# Patient Record
Sex: Male | Born: 1955
Health system: Southern US, Community
[De-identification: ages and names within clinical notes are randomized; demographics above are authoritative.]

## PROBLEM LIST (undated history)

## (undated) DIAGNOSIS — M1990 Unspecified osteoarthritis, unspecified site: Secondary | ICD-10-CM

## (undated) DIAGNOSIS — F988 Other specified behavioral and emotional disorders with onset usually occurring in childhood and adolescence: Secondary | ICD-10-CM

## (undated) DIAGNOSIS — Z9689 Presence of other specified functional implants: Secondary | ICD-10-CM

## (undated) DIAGNOSIS — G0490 Encephalitis and encephalomyelitis, unspecified: Secondary | ICD-10-CM

## (undated) DIAGNOSIS — H5440 Blindness, one eye, unspecified eye: Secondary | ICD-10-CM

## (undated) DIAGNOSIS — M1710 Unilateral primary osteoarthritis, unspecified knee: Secondary | ICD-10-CM

## (undated) DIAGNOSIS — M549 Dorsalgia, unspecified: Secondary | ICD-10-CM

## (undated) DIAGNOSIS — K5792 Diverticulitis of intestine, part unspecified, without perforation or abscess without bleeding: Secondary | ICD-10-CM

## (undated) DIAGNOSIS — Z87442 Personal history of urinary calculi: Secondary | ICD-10-CM

## (undated) DIAGNOSIS — Z9889 Other specified postprocedural states: Secondary | ICD-10-CM

## (undated) DIAGNOSIS — G9050 Complex regional pain syndrome I, unspecified: Secondary | ICD-10-CM

## (undated) DIAGNOSIS — L0291 Cutaneous abscess, unspecified: Secondary | ICD-10-CM

## (undated) HISTORY — PX: SPINAL CORD STIMULATOR IMPLANT: SHX2422

## (undated) HISTORY — PX: FOOT NEUROMA SURGERY: SHX646

## (undated) HISTORY — PX: TUMOR REMOVAL: SHX12

## (undated) HISTORY — PX: EYE SURGERY: SHX253

## (undated) HISTORY — PX: HERNIA REPAIR: SHX51

## (undated) HISTORY — PX: OTHER SURGICAL HISTORY: SHX169

---

## 1998-11-09 ENCOUNTER — Ambulatory Visit (HOSPITAL_BASED_OUTPATIENT_CLINIC_OR_DEPARTMENT_OTHER): Admission: RE | Admit: 1998-11-09 | Discharge: 1998-11-09 | Payer: Self-pay | Admitting: Orthopedic Surgery

## 1999-07-02 ENCOUNTER — Ambulatory Visit (HOSPITAL_COMMUNITY): Admission: RE | Admit: 1999-07-02 | Discharge: 1999-07-02 | Payer: Self-pay | Admitting: *Deleted

## 1999-07-02 ENCOUNTER — Encounter: Payer: Self-pay | Admitting: *Deleted

## 1999-07-04 ENCOUNTER — Encounter: Payer: Self-pay | Admitting: *Deleted

## 1999-07-04 ENCOUNTER — Ambulatory Visit (HOSPITAL_COMMUNITY): Admission: RE | Admit: 1999-07-04 | Discharge: 1999-07-04 | Payer: Self-pay | Admitting: *Deleted

## 2000-07-21 ENCOUNTER — Emergency Department (HOSPITAL_COMMUNITY): Admission: EM | Admit: 2000-07-21 | Discharge: 2000-07-21 | Payer: Self-pay | Admitting: Emergency Medicine

## 2000-08-08 ENCOUNTER — Encounter: Payer: Self-pay | Admitting: Physical Medicine and Rehabilitation

## 2000-08-08 ENCOUNTER — Ambulatory Visit (HOSPITAL_COMMUNITY)
Admission: RE | Admit: 2000-08-08 | Discharge: 2000-08-08 | Payer: Self-pay | Admitting: Physical Medicine and Rehabilitation

## 2001-10-17 ENCOUNTER — Emergency Department (HOSPITAL_COMMUNITY): Admission: EM | Admit: 2001-10-17 | Discharge: 2001-10-17 | Payer: Self-pay

## 2003-11-15 ENCOUNTER — Ambulatory Visit (HOSPITAL_COMMUNITY): Admission: RE | Admit: 2003-11-15 | Discharge: 2003-11-15 | Payer: Self-pay | Admitting: Family Medicine

## 2007-11-03 ENCOUNTER — Ambulatory Visit (HOSPITAL_BASED_OUTPATIENT_CLINIC_OR_DEPARTMENT_OTHER)
Admission: RE | Admit: 2007-11-03 | Discharge: 2007-11-03 | Payer: Self-pay | Admitting: Physical Medicine and Rehabilitation

## 2007-11-18 ENCOUNTER — Ambulatory Visit (HOSPITAL_COMMUNITY): Admission: RE | Admit: 2007-11-18 | Discharge: 2007-11-19 | Payer: Self-pay | Admitting: Orthopedic Surgery

## 2011-04-02 NOTE — Op Note (Signed)
NAME:  Alexander Harper, Alexander Harper                   ACCOUNT NO.:  [id number]   MEDICAL RECORD NO.:  [id number]          PATIENT TYPE:  AMB   LOCATION:  DSC                          FACILITY:  [facility]   PHYSICIAN:  Richard D. Ethelene Hal, M.D. DATE OF BIRTH:  06/14/1956   DATE OF PROCEDURE:  11/03/2007  DATE OF DISCHARGE:                               OPERATIVE REPORT   Alexander Harper is a 55 year old male who is having chronic pain lower limb  pain, specifically in his foot and ankle.  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  He has failed lumbar  sympathetic blocks.  Neurontin gives him a little bit of relief, but it  does cause some cognitive side-effects.  After reviewing information on  the spinal cord stimulator, he wanted to proceed with a trial and  possible permanent implantation to help treat his pain and increase his  function and potentially get off of the Neurontin.   PROCEDURE:  Percutaneous placement of ANS spinal cord stimulator lead  Octrode, model number [id number], lot number [id number].   ANESTHESIA:  Conscious sedation.   The patient was pre-medicated with 300 mg of  IV Clindamycin. After  informed consent was signed, the patient was brought back to the  operating room and placed prone on the operating table.  His skin was  cleansed with Betadine x3.  He was sterilely draped.  With a 14 gauge 4  inch spinal needle, I placed this in the L2-L3 interspace using  intermittent AP fluoroscopic imaging.  The epidural space was localized  using loss of resistance technique.  I then placed the ANS Octrode trial  lead through the needle.  This was done under intermittent fluoroscopic  imaging.  I placed the superior aspect of the lead at the inferior  aspect of the T11 vertebral body.  The lead was checked under AP and  lateral fluoroscopic imaging and was noted to be in the appropriate  position.  The lead was then  hooked up to the generator with the ANS  representative present.  The upper leads got excellent coverage in the  knee and below into the foot.  The lower leads got coverage into the  left foot where he does experience 5% of his pain.  The middle leads got  good coverage on both sides but the paresthesias were also felt in the  thigh region which is not an area of his pain.  This was deemed to be a  good trial with good coverage of the pain that he experiences everyday.  I then withdrew the stylet as well as the introducer needle under  intermittent fluoroscopic imaging to make sure the spinal cord  stimulator lead did not move.  Using 2-0 Prolene, I sutured the lead to  the skin x2.  A Tegaderm dressing was then applied.  The patient was  brought back to the recovery room in stable condition.  The patient will  follow up with me in one week.  Richard D. Ethelene Hal, M.D.  Electronically Signed     RDR/MEDQ  D:  11/03/2007  T:  11/03/2007  Job:  [id number]

## 2011-04-02 NOTE — Op Note (Signed)
NAMEFRANS, Alexander Harper                   ACCOUNT NO.:  [id number]   MEDICAL RECORD NO.:  [id number]          PATIENT TYPE:  OIB   LOCATION:  5015                         FACILITY:  [facility]   PHYSICIAN:  Alvy Beal, MD    DATE OF BIRTH:  November 18, 1956   DATE OF PROCEDURE:  DATE OF DISCHARGE:                               OPERATIVE REPORT   PREOPERATIVE DIAGNOSIS:  Chronic bilateral lower extremity pain.   POSTOPERATIVE DIAGNOSIS:  Chronic bilateral lower extremity pain.   OPERATIVE PROCEDURE:  Permanent spinal cord stimulator implantation.   HISTORY:  This is a very pleasant 55 year old gentleman who has had  chronic bilateral lower extremity pain.  He had a trial spinal cord  stimulator placed by Dr. Ethelene Hal, my partner, and did quite well.  Because  of positive response to the trial implant, he elected to proceed with  the permanent implantation.   All appropriate risks, benefits and alternatives were discussed with the  patient and consent was obtained.   OPERATIVE NOTE:  The patient was brought to the operating room.  After  successful induction of general anesthesia and endotracheal intubation,  TEDs and SCDs were applied.  The patient was turned prone onto the  Wilson frame.  The arms were placed overhead and all bony prominences  were well-padded.   The back was then prepped and draped in a standard fashion.   Using spinal needles, I used lateral fluoroscopy to identify the L5  vertebral body and then counted up to the T12 vertebral body.  A midline  incision was then made centered at the T11-12 interbody space and sharp  dissection was carried out down to the deep fascia.  The deep fascia was  sharply incised and the paraspinal muscles were stripped off to expose  the lamina.  I then placed a clamp at the T12-11 interbody space and  confirmed that this was the appropriate level by counting up from the L5  vertebral body.  Once confirmed, I then used a Leksell rongeur to  resect  the majority of the T11 and T12 spinous processes and then exposed the  ligamentum flavum.  I used a micro fine curette to develop a plane  between the ligamentum flavum and the underlying dura and resected that  with a combination of 2- and 3-mm Kerrison.  At this point I had  excellent visualization of the dorsal aspect of the thecal sac.  At this  point, I then passed a Penfield 4 cranially in order to develop a slight  plane to pass the permanent implant.   With hemostasis obtained, I then took the standard 16 lead and passed it  up.  Using AP and lateral fluoroscopy, I confirmed that it was in the  central region equidistant to both pedicles and spanning the T11  vertebral body and going just to the inferior aspect of the T10  vertebral body.   At this point with the implant properly positioned, I then made a small  window at T12-L1 interspinous ligament and wove the leads through this  to prevent migration.  I then secured it down with a 2-0 FiberWire.  I  then rechecked the AP and lateral films to ensure that the stimulator  had not migrated.  Once confirmed, I then attempted to place the [facility]  battery; however, I made an incision on the left-hand side per the  patient's request and then dissected down.  The patient was quite thin  and so I actually I got submuscular very rapidly.  I then passed the  large Eon battery; however, because of his body habitus, I could not  implant it the necessary 4.5 cm in depth.  As a result, I had to switch  to the [employer].  This only needed to be at a depth of 2.5 cm  which was easy to achieve.  I then connected the leads to the battery  and checked the battery and ensured that it was working functionally.   I then passed the submuscular passer trocars from the thoracic incision  down to the left-sided flank incision where the battery would housed.  I  then passed the leads through this and then secured them to the battery.  I  then wrapped the leads underneath the battery and passed it into the  submuscular plane and secured it to the fascia.  I then irrigated both  wounds copiously with normal saline, closed the deep fascia with  interrupted #1 Vicryl sutures, superficial with 2-0 Vicryl sutures and 3-  0 Monocryl for the skin.  Once both wounds were appropriately closed, a  dry dressing was applied and the patient was extubated and transferred  to the PACU without incident.  At the end of the case, all needle and  sponge counts were correct.   FIRST ASSISTANT:  Crissie Reese, PA      Alvy Beal, MD  Electronically Signed     DDB/MEDQ  D:  11/18/2007  T:  11/18/2007  Job:  [id number]   cc:   Alvy Beal, MD

## 2011-08-23 LAB — CBC
MCV: 89.8
RBC: 4.57
RDW: 13.7

## 2011-08-23 LAB — COMPREHENSIVE METABOLIC PANEL
AST: 23
Alkaline Phosphatase: 91
GFR calc Af Amer: >60
Glucose, Bld: 101 — ABNORMAL HIGH
Total Bilirubin: 0.6

## 2011-08-23 LAB — POCT HEMOGLOBIN-HEMACUE: Operator id: 112821

## 2013-04-29 ENCOUNTER — Other Ambulatory Visit: Payer: Self-pay | Admitting: Family Medicine

## 2013-04-29 ENCOUNTER — Ambulatory Visit
Admission: RE | Admit: 2013-04-29 | Discharge: 2013-04-29 | Disposition: A | Discharge status: Home / Self Care | Payer: BC Managed Care – PPO | Source: Ambulatory Visit | Attending: Family Medicine | Admitting: Family Medicine

## 2013-04-29 DIAGNOSIS — R05 Cough: Secondary | ICD-10-CM

## 2013-04-29 DIAGNOSIS — R059 Cough, unspecified: Secondary | ICD-10-CM

## 2015-06-27 ENCOUNTER — Other Ambulatory Visit: Payer: Self-pay | Admitting: Family Medicine

## 2015-06-27 ENCOUNTER — Ambulatory Visit
Admission: RE | Admit: 2015-06-27 | Discharge: 2015-06-27 | Disposition: A | Discharge status: Home / Self Care | Payer: BLUE CROSS/BLUE SHIELD | Source: Ambulatory Visit | Attending: Family Medicine | Admitting: Family Medicine

## 2015-06-27 DIAGNOSIS — M79645 Pain in left finger(s): Secondary | ICD-10-CM

## 2015-06-27 DIAGNOSIS — M25522 Pain in left elbow: Secondary | ICD-10-CM

## 2017-05-23 ENCOUNTER — Other Ambulatory Visit: Payer: Self-pay

## 2017-05-23 DIAGNOSIS — M25562 Pain in left knee: Secondary | ICD-10-CM

## 2017-06-02 ENCOUNTER — Ambulatory Visit
Admission: RE | Admit: 2017-06-02 | Discharge: 2017-06-02 | Disposition: A | Discharge status: Home / Self Care | Payer: BLUE CROSS/BLUE SHIELD | Source: Ambulatory Visit | Attending: Orthopedic Surgery | Admitting: Orthopedic Surgery

## 2017-06-02 DIAGNOSIS — M25562 Pain in left knee: Secondary | ICD-10-CM

## 2017-06-02 MED ORDER — IOPAMIDOL (ISOVUE-M 200) INJECTION 41%
50.0000 mL | Freq: Once | INTRAMUSCULAR | Status: AC
  Administered 2017-06-02: 50 mL via INTRA_ARTICULAR

## 2017-09-18 DIAGNOSIS — L0291 Cutaneous abscess, unspecified: Secondary | ICD-10-CM

## 2017-09-18 HISTORY — DX: Cutaneous abscess, unspecified: L02.91

## 2017-12-16 ENCOUNTER — Other Ambulatory Visit: Payer: Self-pay

## 2017-12-16 ENCOUNTER — Encounter (HOSPITAL_COMMUNITY): Payer: Self-pay

## 2017-12-16 ENCOUNTER — Emergency Department (HOSPITAL_COMMUNITY)
Admission: EM | Admit: 2017-12-16 | Discharge: 2017-12-16 | Disposition: A | Discharge status: Home / Self Care | Payer: BLUE CROSS/BLUE SHIELD | Attending: Emergency Medicine | Admitting: Emergency Medicine

## 2017-12-16 DIAGNOSIS — R590 Localized enlarged lymph nodes: Secondary | ICD-10-CM | POA: Insufficient documentation

## 2017-12-16 DIAGNOSIS — Z79899 Other long term (current) drug therapy: Secondary | ICD-10-CM | POA: Insufficient documentation

## 2017-12-16 DIAGNOSIS — L089 Local infection of the skin and subcutaneous tissue, unspecified: Secondary | ICD-10-CM | POA: Diagnosis not present

## 2017-12-16 DIAGNOSIS — F17210 Nicotine dependence, cigarettes, uncomplicated: Secondary | ICD-10-CM | POA: Insufficient documentation

## 2017-12-16 DIAGNOSIS — L729 Follicular cyst of the skin and subcutaneous tissue, unspecified: Secondary | ICD-10-CM

## 2017-12-16 HISTORY — DX: Complex regional pain syndrome I, unspecified: G90.50

## 2017-12-16 HISTORY — DX: Other specified postprocedural states: Z98.890

## 2017-12-16 HISTORY — DX: Blindness, one eye, unspecified eye: H54.40

## 2017-12-16 HISTORY — DX: Encephalitis and encephalomyelitis, unspecified: G04.90

## 2017-12-16 HISTORY — DX: Presence of other specified functional implants: Z96.89

## 2017-12-16 HISTORY — DX: Diverticulitis of intestine, part unspecified, without perforation or abscess without bleeding: K57.92

## 2017-12-16 MED ORDER — LIDOCAINE HCL (PF) 1 % IJ SOLN
30.0000 mL | Freq: Once | INTRAMUSCULAR | Status: AC
  Administered 2017-12-16: 30 mL
  Filled 2017-12-16: qty 30

## 2017-12-16 MED ORDER — DOXYCYCLINE HYCLATE 100 MG PO CAPS: 100.0000 mg | ORAL_CAPSULE | Freq: Two times a day (BID) | ORAL | 0 refills | Status: AC

## 2017-12-16 NOTE — Discharge Instructions (Signed)
Please see the information and instructions below regarding your visit.  Your diagnoses today include:  1. Cyst of buttocks   2. Skin infection    Cellulitis is a superficial skin infection. Please take your antibiotics as prescribed for their ENTIRE prescribed duration.   Tests performed today include: See side panel of your discharge paperwork for testing performed today. Vital signs are listed at the bottom of these instructions.   Medications prescribed:    Take any prescribed medications only as prescribed, and any over the counter medications only as directed on the packaging.  Please take all of your antibiotics until finished.   Doxycycline can make folks sensitive to the skin, so please ensure that you are wearing sunscreen or coverage if you are in the sun.  You may develop abdominal discomfort or nausea from the antibiotic. If this occurs, you may take it with food. Some patients also get diarrhea with antibiotics. You may help offset this with probiotics which you can buy or get in yogurt. Do not eat or take the probiotics until 2 hours after your antibiotic.  Some people develop allergies to antibiotics. Symptoms of antibiotic allergy can be mild and include a flat rash and itching. They can also be more serious and include:  ?Hives - Hives are raised, red patches of skin that are usually very itchy.  ?Lip or tongue swelling  ?Trouble swallowing or breathing  ?Blistering of the skin or mouth.  If you have any of these serious symptoms, please seek emergency medical care immediately   Home care instructions:  Please follow any educational materials contained in this packet.    Keep affected area above the level of your heart when possible. Wash area gently twice a day with warm soapy water. Do not apply alcohol or hydrogen peroxide. Cover the area if it draining or weeping.   Please perform sitz bath's, where you sit and lukewarm water.  This will promote drainage out of  the infected site, as well as relieve your hemorrhoid pain.  Follow-up instructions: Please follow-up with your primary care provider or the ED in 48 hours for a check of the infection if symptoms are not improving.   Please follow-up with Dr. Barry Dienes of general surgery regarding the cyst removal.  Return instructions:  Please return to the Emergency Department if you experience worsening symptoms. Call your doctor sooner or return to the ER if you develop worsening signs of infection such as: increased redness, increased pain, pus, fever, or other symptoms that concern you. * Please return if you have any other emergent concerns.  Additional Information:   Your vital signs today were: BP (!) 121/91 (BP Location: Left Arm)    Pulse 100    Temp 99.3 F (37.4 C) (Oral)    Resp 14    Ht 6\' 5"  (1.956 m)    Wt 59.5 kg (131 lb 4 oz)    SpO2 100%    BMI 15.56 kg/m  If your blood pressure (BP) was elevated on multiple readings during this visit above 130 for the top number or above 80 for the bottom number, please have this repeated by your primary care provider within one month. --------------  Thank you for allowing [place] to participate in your care today. It was a pleasure taking care of you today!

## 2017-12-16 NOTE — ED Provider Notes (Signed)
Medical screening examination/treatment/procedure(s) were conducted as a shared visit with non-physician practitioner(s) and myself.  I personally evaluated the patient during the encounter.   EKG Interpretation None      Patient seen by me along with the physician assistant.  Patient with complaint of skin abscess right buttocks area on and off since November.  Has been on courses of antibiotics gets better drains occasionally has seen GI but it was healed at that time.  Starting about a week ago it reoccurred.  Best patient can tell us in the same area.  Patient's been doing sitz bath's.  Has drained some on its own.  Patient nontoxic no acute distress.  Patient with a well-circumscribed area of induration right buttocks measuring about 3 x 4 cm.  Surrounding erythema to the same measurement.  A central core measuring about 5 mm.  No real fluctuance.  Seems well-circumscribed.  Does not seem to have any deep extension.  Do not feel it is related to a perirectal abscess or fistula.  Suspect it is a chronic skin abscess that is reoccurring because it has not been excised.  I&D not required today because it is fairly settled down.  No area of significant fluctuance.  Recommend doxycycline and referral to general surgery for further evaluation and consideration for excision.     Fredia Sorrow, MD 12/16/17 1755

## 2017-12-16 NOTE — ED Notes (Signed)
Chaperoned Alexander Lor, PA with a rectal exam, skin abscess on the right buttock cheek, and anterior inguinal lymph nodes. Pt tolerated it well.

## 2017-12-16 NOTE — ED Provider Notes (Signed)
[facility] DEPT Provider Note   CSN: [id number] Arrival date & time: 12/16/17  1054     History   Chief Complaint Chief Complaint  Patient presents with  . Abscess    HPI LABRANDON [name] is a 62 y.o. male.  HPI  Patient is a 62 year old male with a history of convex regional pain syndrome of the right foot, diverticulitis, and spinal cord stimulator placement presenting for redness and a "knot" on his right buttock.  Patient reports that he has had a recurrent problem with infections around his buttocks since November 2018.  Patient does report that he has had some circumferential irritation around his anus since this time.  When patient initially had a not near his rectum that was draining, patient was placed on a course of antibiotics, which was repeated earlier in the month of January 2019.  At this time, patient denies any fevers, chills, active drainage from the site, pain with bowel movements.  Patient is seen by gastroenterology, who office he called today and was referred to the emergency department for further evaluation.  Of note, patient reports he has had inguinal lymphadenopathy at multiple times since November 2018.  Patient reports that he has persistently had right inguinal lymphadenopathy that is recurrent today.  Past Medical History:  Diagnosis Date  . Blind right eye   . CRPS (complex regional pain syndrome type I)   . Diverticulitis   . Encephalitis   . S/P insertion of spinal cord stimulator     There are no active problems to display for this patient.   Past Surgical History:  Procedure Laterality Date  . EYE SURGERY    . HERNIA REPAIR    . knee sugery Left   . toe repair    . TUMOR REMOVAL         Home Medications    Prior to Admission medications   Medication Sig Start Date End Date Taking? Authorizing Provider  gabapentin (NEURONTIN) 600 MG tablet Take 600 mg by mouth every 4 (four) hours. 10/23/17 01/21/18 Yes  [provider]  HYDROcodone-acetaminophen (NORCO/VICODIN) 5-325 MG tablet Take 1 tablet by mouth 2 (two) times daily as needed for moderate pain.  11/27/17  Yes [provider]  methylphenidate (METADATE ER) 20 MG ER tablet Take 20 mg by mouth 2 (two) times daily. 11/27/17  Yes [provider]  methylphenidate (RITALIN) 20 MG tablet Take 20 mg by mouth 3 (three) times daily. 11/27/17  Yes [provider]  naproxen (NAPROSYN) 500 MG tablet Take 500 mg by mouth as needed for mild pain.  04/24/17  Yes [provider]  sildenafil (VIAGRA) 100 MG tablet Take 100 mg by mouth as needed for erectile dysfunction.  04/07/13  Yes [provider]  fluconazole (DIFLUCAN) 150 MG tablet Take 150 mg by mouth once. 11/13/17   [provider]  sulfamethoxazole-trimethoprim (BACTRIM DS,SEPTRA DS) 800-160 MG tablet Take 1 tablet by mouth 2 (two) times daily. 11/09/17   [provider]    Family History Family History  Problem Relation Age of Onset  . Rheum arthritis Mother   . Tuberculosis Father   . Heart failure Father   . Diabetes Father     Social History Social History   Tobacco Use  . Smoking status: Current Every Day Smoker    Packs/day: 1.00    Types: Cigarettes  . Smokeless tobacco: Never Used  Substance Use Topics  . Alcohol use: Yes  Comment: rare  . Drug use: No     Allergies   Other; Lovenox [enoxaparin sodium]; and Penicillins   Review of Systems Review of Systems  Constitutional: Negative for chills and fever.  Gastrointestinal: Negative for abdominal pain, blood in stool, diarrhea and vomiting.  Skin: Positive for color change.  Hematological: Positive for adenopathy.     Physical Exam Updated Vital Signs BP (!) 121/91 (BP Location: Left Arm)   Pulse 100   Temp 99.3 F (37.4 C) (Oral)   Resp 14   Ht 6\' 5"  (1.956 m)   Wt 59.5 kg (131 lb 4 oz)   SpO2 100%   BMI 15.56 kg/m   Physical Exam    Constitutional: He appears well-developed and well-nourished. No distress.  Sitting comfortably in bed.  HENT:  Head: Normocephalic and atraumatic.  Eyes: Conjunctivae are normal. Right eye exhibits no discharge. Left eye exhibits no discharge.  EOMs normal to gross examination.  Neck: Normal range of motion.  Cardiovascular: Normal rate and regular rhythm.  Intact, 2+ radial pulse.  Pulmonary/Chest:  Normal respiratory effort. Patient converses comfortably. No audible wheeze or stridor.  Abdominal: He exhibits no distension.  Genitourinary:  Genitourinary Comments: Examination performed with nurse chaperone, Di Kindle, present.  Patient exhibits multiple prolapsed hemorrhoids extruding from the anus.  No thrombosed external hemorrhoids.  There is an area of erythema measuring approximately 3 cm in width by 4 some meters in length on the right buttocks lateral to the gluteal cleft.  It does not extend to the rectum.  There is a well-circumscribed core with active drainage. Patient exhibits normal rectal tone, and there is no induration of the rectum that extends upwards towards this area of induration superior to the rectum.  Musculoskeletal: Normal range of motion.  Lymphadenopathy:       Right: Inguinal adenopathy present.  Neurological: He is alert.  Cranial nerves intact to gross observation. Patient moves extremities without difficulty.  Skin: Skin is warm and dry. He is not diaphoretic.  Psychiatric: He has a normal mood and affect. His behavior is normal. Judgment and thought content normal.  Nursing note and vitals reviewed.    ED Treatments / Results  Labs (all labs ordered are listed, but only abnormal results are displayed) Labs Reviewed - No data to display  EKG  EKG Interpretation None       Radiology No results found.  Procedures Procedures (including critical care time)  Medications Ordered in ED Medications  lidocaine (PF) (XYLOCAINE) 1 % injection 30 mL  (30 mLs Infiltration Given 12/16/17 1656)     Initial Impression / Assessment and Plan / ED Course  I have reviewed the triage vital signs and the nursing notes.  Pertinent labs & imaging results that were available during my care of the patient were reviewed by me and considered in my medical decision making (see chart for details).     Final Clinical Impressions(s) / ED Diagnoses   Final diagnoses:  Cyst of buttocks  Skin infection  Inguinal adenopathy   Patient is nontoxic-appearing, afebrile, and in no acute distress.  Patient was independently evaluated by Dr. Fredia Sorrow, who agrees that patient has infected cyst of the buttocks.  Per this discussion, recommendation to refer to general surgery for full excision rather than I&D today.  Patient will be placed on doxycycline in the interim.  Patient given return precautions for any increasing erythema, rapidly enlarging area of fluctuance, or fever or chills with symptoms.    Patient  was also noted to have inguinal lymphadenopathy, palpable in the right horizontal chain.  I encouraged follow-up with primary care provider regarding this finding, as it is not necessarily in the drainage pattern of the location of the cyst.  I encouraged follow-up if persistent inguinal lymphadenopathy after resolution of the cyst.  This is a shared visit with Dr. Fredia Sorrow. Patient was independently evaluated by this attending physician. Attending physician consulted in evaluation and discharge management.   ED Discharge Orders    None       Tamala Julian 12/16/17 1859    Fredia Sorrow, MD 12/16/17 [id number]

## 2017-12-16 NOTE — ED Notes (Signed)
Lidocaine given by provider for I&D.

## 2017-12-16 NOTE — ED Notes (Signed)
Patient was standing at the door for discharge. He didn't want his vital signs taken for discharge.

## 2017-12-16 NOTE — ED Triage Notes (Signed)
Patient c/o right buttock abscess and states it is approx an inch from his anus. Patient called the gastroenterologist that he ha seen before and was told to come to the ED.

## 2017-12-22 ENCOUNTER — Ambulatory Visit: Payer: Self-pay | Admitting: Surgery

## 2017-12-22 NOTE — H&P (Signed)
History of Present Illness Alexander Harper M. Karyl Sharrar MD; 12/22/2017 4:48 PM) Patient words: CC: Right gluteal lesion/abscess HPI: Mr. Flye is a pleasant 52yoM with hx of RSD presents for evaluation of a right gluteal lesion.  He first noticed this 09/2017.  At that time he had pain and a red heaped up fluctuant lesion.  This spontaneously drained purulent fluid.  It has recurred twice.  He was seen last week in the urgent office for this problem.  He was recently prescribed a course of doxycycline and has noted dramatic improvement.  At this time he denies any fever/chills.  Currently he denies any significant pain in the region.  He denies a history of subcutaneous abscesses or cysts in the past  Last colonoscopy was 5-61yrs ago and reportedly showed diverticulosis and there is some polyps removed.  He states these were benign.  He is following with GI for follow-up colonoscopy.  PMH:RDS PSH: No prior gluteal/anorectal procedures; spinal stimulator for RDS FHx: Denies FHx of malignancy Social: Denies use of tobacco/EtOH/drugs ROS: A comprehensive 10 system review of systems was completed with the patient and pertinent findings as noted above.  The patient is a 62 year old male.   Allergies (Tanisha A. Owens Shark, [place]; 12/22/2017 3:41 PM) Penicillins   Lovenox *ANTICOAGULANTS*   Allergies Reconciled    Medication History (Tanisha A. Owens Shark, [place]; 12/22/2017 3:41 PM) Hydrocodone-Acetaminophen  (5-325MG  Tablet, Oral) Active. Methylphenidate HCl  (20MG  Tablet, Oral) Active. Doxycycline Hyclate  (100MG  Capsule, Oral) Active. Gabapentin  (600MG  Tablet, Oral) Active. Sulfamethoxazole-Trimethoprim  (800-160MG  Tablet, Oral) Active. Medications Reconciled     Review of Systems Alexander Harper M. Kassity Woodson MD; 12/22/2017 4:46 PM) General Not Present- Appetite Loss, Chills, Fatigue, Fever, Night Sweats, Weight Gain and Weight Loss. Skin Present- New Lesions. Not Present- Change in Wart/Mole, Dryness, Hives, Jaundice,  Non-Healing Wounds, Rash and Ulcer. HEENT Present- Wears glasses/contact lenses. Not Present- Earache, Hearing Loss, Hoarseness, Nose Bleed, Oral Ulcers, Ringing in the Ears, Seasonal Allergies, Sinus Pain, Sore Throat, Visual Disturbances and Yellow Eyes. Respiratory Not Present- Bloody sputum, Chronic Cough, Difficulty Breathing, Snoring and Wheezing. Breast Not Present- Breast Mass, Breast Pain, Nipple Discharge and Skin Changes. Cardiovascular Not Present- Chest Pain, Difficulty Breathing Lying Down, Leg Cramps, Palpitations, Rapid Heart Rate, Shortness of Breath and Swelling of Extremities. Gastrointestinal Present- Change in Bowel Habits, Constipation, Gets full quickly at meals, Hemorrhoids and Rectal Pain. Not Present- Abdominal Pain, Bloating, Bloody Stool, Chronic diarrhea, Difficulty Swallowing, Excessive gas, Indigestion, Nausea and Vomiting. Male Genitourinary Present- Impotence. Not Present- Blood in Urine, Change in Urinary Stream, Frequency, Nocturia, Painful Urination, Urgency and Urine Leakage. Musculoskeletal Present- Back Pain. Not Present- Joint Pain, Joint Stiffness, Muscle Pain, Muscle Weakness and Swelling of Extremities. Neurological Present- Trouble walking. Not Present- Decreased Memory, Fainting, Headaches, Numbness, Seizures, Tingling, Tremor and Weakness. Psychiatric Present- Change in Sleep Pattern. Not Present- Anxiety, Bipolar, Depression, Fearful and Frequent crying. Endocrine Present- Cold Intolerance. Not Present- Excessive Hunger, Hair Changes, Heat Intolerance and New Diabetes. Hematology Not Present- Blood Thinners, Easy Bruising, Excessive bleeding, Gland problems, HIV and Persistent Infections.  Vitals (Tanisha A. Brown RMA; 12/22/2017 3:41 PM) 12/22/2017 3:40 PM Weight: 163.4 lb   Height: 77 in  Body Surface Area: 2.05 m   Body Mass Index: 19.38 kg/m   Temp.: 98.6 F    Pulse: 102 (Regular)    BP: 140/82 (Sitting, Left Arm, Standard)       Physical  Exam Alexander Harper M. Alann Avey MD; 12/22/2017 4:48 PM) The physical exam findings are  as follows: Note: Constitutional: No acute distress; conversant; no deformities Eyes: Moist conjunctiva; no lid lag; anicteric sclerae; pupils equal round and reactive to light Neck: Trachea midline; no palpable thyromegaly Lungs: Normal respiratory effort; no tactile fremitus CV: Regular rate and rhythm; no palpable thrill; no pitting edema GI: Abdomen soft, nontender, nondistended; no palpable hepatosplenomegaly. Anorectal: Right gluteal skin lesion-1 x 1 cm with red/chronic skin changes and central umbilication consistent with ruptured sebaceous cyst.  Perianal skin tags.  DRE-no palpable masses or lesions. Anoscopy: 3 column internal hemorrhoids.  No visible fissures or fistulas MSK: Normal gait; no clubbing/cyanosis Psychiatric: Appropriate affect; alert and oriented 3 Lymphatic: No palpable cervical or axillary lymphadenopathy; questions right inguinal lymphadenopathy vs normal changes - if so, <78mm in size. No overlying skin changes    Assessment & Plan Alexander Harper M. Parilee Hally MD; 12/22/2017 4:51 PM) ABSCESS OF BUTTOCK, RIGHT (L02.31) Impression: 61yoM with recurrent abscess of right buttock now drained and resolving - likely has a sebaceous cyst in this region. -The pathophysiology of sebaceous/epidermoid cysts was discussed at length with the patient. -Will plan excision of sebaceous cyst of right buttock in OR; we discussed possibility of leaving the wound open if there is signs of cellulitis/infection at the time of the procedure. -The planned procedure, material risks (including, but not limited to, pain, bleeding, infection, scarring, recurrence, need for additional procedures, recurrence) benefits and alternatives to surgery were discussed at length. I noted a good probability that the procedure would help improve their symptoms. His questions were answered to his satisfaction and he elected to proceed  with surgery. Current Plans ANOSCOPY, DIAGNOSTIC ([id number]) Signed electronically by Ileana Roup, MD (12/22/2017 4:51 PM)

## 2017-12-23 ENCOUNTER — Other Ambulatory Visit: Payer: Self-pay

## 2017-12-23 ENCOUNTER — Encounter (HOSPITAL_BASED_OUTPATIENT_CLINIC_OR_DEPARTMENT_OTHER): Payer: Self-pay

## 2017-12-23 NOTE — Progress Notes (Signed)
Spoke with:  Linna Hoff NPO:  After Midnight, no gum, candy, or mints  No SMOKING Arrival time: 0900AM Labs:  Istat 8 AM medications: Gabapentin, Hibiclens shower night before and AM DOS Pre op orders:  Yes Ride home:  Almyra Free (wife) [phone]

## 2017-12-31 ENCOUNTER — Encounter (HOSPITAL_BASED_OUTPATIENT_CLINIC_OR_DEPARTMENT_OTHER)
Admission: RE | Disposition: A | Discharge status: Home / Self Care | Payer: Self-pay | Source: Ambulatory Visit | Attending: Surgery

## 2017-12-31 ENCOUNTER — Encounter (HOSPITAL_BASED_OUTPATIENT_CLINIC_OR_DEPARTMENT_OTHER): Payer: Self-pay

## 2017-12-31 ENCOUNTER — Ambulatory Visit (HOSPITAL_BASED_OUTPATIENT_CLINIC_OR_DEPARTMENT_OTHER): Payer: BLUE CROSS/BLUE SHIELD | Admitting: Anesthesiology

## 2017-12-31 ENCOUNTER — Ambulatory Visit (HOSPITAL_BASED_OUTPATIENT_CLINIC_OR_DEPARTMENT_OTHER)
Admission: RE | Admit: 2017-12-31 | Discharge: 2017-12-31 | Disposition: A | Discharge status: Home / Self Care | Payer: BLUE CROSS/BLUE SHIELD | Source: Ambulatory Visit | Attending: Surgery | Admitting: Surgery

## 2017-12-31 DIAGNOSIS — F329 Major depressive disorder, single episode, unspecified: Secondary | ICD-10-CM | POA: Insufficient documentation

## 2017-12-31 DIAGNOSIS — G8929 Other chronic pain: Secondary | ICD-10-CM | POA: Diagnosis not present

## 2017-12-31 DIAGNOSIS — M1990 Unspecified osteoarthritis, unspecified site: Secondary | ICD-10-CM | POA: Diagnosis not present

## 2017-12-31 DIAGNOSIS — F17200 Nicotine dependence, unspecified, uncomplicated: Secondary | ICD-10-CM | POA: Diagnosis not present

## 2017-12-31 DIAGNOSIS — L0231 Cutaneous abscess of buttock: Secondary | ICD-10-CM | POA: Diagnosis present

## 2017-12-31 DIAGNOSIS — H5461 Unqualified visual loss, right eye, normal vision left eye: Secondary | ICD-10-CM | POA: Diagnosis not present

## 2017-12-31 HISTORY — PX: CYST EXCISION: SHX5701

## 2017-12-31 HISTORY — DX: Dorsalgia, unspecified: M54.9

## 2017-12-31 HISTORY — DX: Other specified behavioral and emotional disorders with onset usually occurring in childhood and adolescence: F98.8

## 2017-12-31 HISTORY — DX: Unspecified osteoarthritis, unspecified site: M19.90

## 2017-12-31 HISTORY — DX: Cutaneous abscess, unspecified: L02.91

## 2017-12-31 HISTORY — DX: Unilateral primary osteoarthritis, unspecified knee: M17.10

## 2017-12-31 HISTORY — DX: Personal history of urinary calculi: Z87.442

## 2017-12-31 LAB — POCT I-STAT, CHEM 8
BUN: 9 mg/dL (ref 6–20)
CALCIUM ION: 1.15 mmol/L (ref 1.15–1.40)
Chloride: 100 mmol/L — ABNORMAL LOW (ref 101–111)
Creatinine, Ser: 0.9 mg/dL (ref 0.61–1.24)
Glucose, Bld: 96 mg/dL (ref 65–99)
HCT: 41 % (ref 39.0–52.0)
Hemoglobin: 13.9 g/dL (ref 13.0–17.0)
Potassium: 3.7 mmol/L (ref 3.5–5.1)
SODIUM: 141 mmol/L (ref 135–145)
TCO2: 28 mmol/L (ref 22–32)

## 2017-12-31 SURGERY — CYST REMOVAL
Anesthesia: Monitor Anesthesia Care | Site: Buttocks | Laterality: Right

## 2017-12-31 MED ORDER — PROPOFOL 500 MG/50ML IV EMUL
INTRAVENOUS | Status: DC | PRN
  Administered 2017-12-31: 200 ug/kg/min via INTRAVENOUS

## 2017-12-31 MED ORDER — MIDAZOLAM HCL 2 MG/2ML IJ SOLN
INTRAMUSCULAR | Status: AC
  Filled 2017-12-31: qty 2

## 2017-12-31 MED ORDER — ONDANSETRON HCL 4 MG/2ML IJ SOLN
INTRAMUSCULAR | Status: DC | PRN
  Administered 2017-12-31: 4 mg via INTRAVENOUS

## 2017-12-31 MED ORDER — LACTATED RINGERS IV SOLN
INTRAVENOUS | Status: DC
  Administered 2017-12-31: 10:00:00 via INTRAVENOUS
  Filled 2017-12-31: qty 1000

## 2017-12-31 MED ORDER — GENTAMICIN SULFATE 40 MG/ML IJ SOLN
5.0000 mg/kg | INTRAMUSCULAR | Status: AC
  Administered 2017-12-31: 300 mg via INTRAVENOUS
  Filled 2017-12-31 (×2): qty 7.5

## 2017-12-31 MED ORDER — FENTANYL CITRATE (PF) 100 MCG/2ML IJ SOLN
INTRAMUSCULAR | Status: AC
Start: 2017-12-31 — End: ?
  Filled 2017-12-31: qty 2

## 2017-12-31 MED ORDER — MIDAZOLAM HCL 2 MG/2ML IJ SOLN
INTRAMUSCULAR | Status: DC | PRN
  Administered 2017-12-31: 2 mg via INTRAVENOUS

## 2017-12-31 MED ORDER — CHLORHEXIDINE GLUCONATE CLOTH 2 % EX PADS
6.0000 | MEDICATED_PAD | Freq: Once | CUTANEOUS | Status: DC
  Filled 2017-12-31: qty 6

## 2017-12-31 MED ORDER — LIDOCAINE 2% (20 MG/ML) 5 ML SYRINGE
INTRAMUSCULAR | Status: DC | PRN
  Administered 2017-12-31: 50 mg via INTRAVENOUS

## 2017-12-31 MED ORDER — CLINDAMYCIN PHOSPHATE 900 MG/50ML IV SOLN
INTRAVENOUS | Status: AC
  Filled 2017-12-31: qty 50

## 2017-12-31 MED ORDER — BUPIVACAINE LIPOSOME 1.3 % IJ SUSP
INTRAMUSCULAR | Status: DC | PRN
  Administered 2017-12-31: 20 mL

## 2017-12-31 MED ORDER — PROPOFOL 10 MG/ML IV BOLUS
INTRAVENOUS | Status: AC
  Filled 2017-12-31: qty 20

## 2017-12-31 MED ORDER — ACETAMINOPHEN 500 MG PO TABS
ORAL_TABLET | ORAL | Status: AC
  Filled 2017-12-31: qty 2

## 2017-12-31 MED ORDER — LIDOCAINE 2% (20 MG/ML) 5 ML SYRINGE
INTRAMUSCULAR | Status: AC
  Filled 2017-12-31: qty 5

## 2017-12-31 MED ORDER — ONDANSETRON HCL 4 MG/2ML IJ SOLN
INTRAMUSCULAR | Status: AC
  Filled 2017-12-31: qty 4

## 2017-12-31 MED ORDER — ACETAMINOPHEN 500 MG PO TABS
1000.0000 mg | ORAL_TABLET | ORAL | Status: AC
  Administered 2017-12-31: 1000 mg via ORAL
  Filled 2017-12-31: qty 2

## 2017-12-31 MED ORDER — CLINDAMYCIN PHOSPHATE 900 MG/50ML IV SOLN
900.0000 mg | INTRAVENOUS | Status: AC
  Administered 2017-12-31: 900 mg via INTRAVENOUS
  Filled 2017-12-31: qty 50

## 2017-12-31 MED ORDER — BUPIVACAINE-EPINEPHRINE (PF) 0.25% -1:200000 IJ SOLN
INTRAMUSCULAR | Status: DC | PRN
  Administered 2017-12-31: 30 mL

## 2017-12-31 SURGICAL SUPPLY — 52 items
ADH SKN CLS APL DERMABOND .7 (GAUZE/BANDAGES/DRESSINGS) ×1
APL SKNCLS STERI-STRIP NONHPOA (GAUZE/BANDAGES/DRESSINGS) ×1
BENZOIN TINCTURE PRP APPL 2/3 (GAUZE/BANDAGES/DRESSINGS) ×2 IMPLANT
BLADE EXTENDED COATED 6.5IN (ELECTRODE) IMPLANT
BLADE HEX COATED 2.75 (ELECTRODE) ×2 IMPLANT
BLADE SURG 10 STRL SS (BLADE) IMPLANT
BLADE SURG 15 STRL LF DISP TIS (BLADE) ×1 IMPLANT
BLADE SURG 15 STRL SS (BLADE) ×2
BRIEF STRETCH FOR OB PAD LRG (UNDERPADS AND DIAPERS) ×2 IMPLANT
CANISTER SUCT 3000ML PPV (MISCELLANEOUS) ×2 IMPLANT
COVER BACK TABLE 60X90IN (DRAPES) ×2 IMPLANT
COVER MAYO STAND STRL (DRAPES) ×2 IMPLANT
DECANTER SPIKE VIAL GLASS SM (MISCELLANEOUS) ×2 IMPLANT
DERMABOND ADVANCED (GAUZE/BANDAGES/DRESSINGS) ×1
DERMABOND ADVANCED .7 DNX12 (GAUZE/BANDAGES/DRESSINGS) ×1 IMPLANT
DRAIN PENROSE 18X1/4 LTX STRL (WOUND CARE) ×2 IMPLANT
DRAPE LAPAROTOMY 100X72 PEDS (DRAPES) ×2 IMPLANT
DRAPE LG THREE QUARTER DISP (DRAPES) IMPLANT
DRAPE UTILITY XL STRL (DRAPES) ×2 IMPLANT
DRSG TEGADERM 2-3/8X2-3/4 SM (GAUZE/BANDAGES/DRESSINGS) ×1 IMPLANT
ELECT REM PT RETURN 9FT ADLT (ELECTROSURGICAL) ×2
ELECTRODE REM PT RTRN 9FT ADLT (ELECTROSURGICAL) ×1 IMPLANT
GAUZE SPONGE 4X4 16PLY NS LF (WOUND CARE) IMPLANT
GAUZE SPONGE 4X4 16PLY XRAY LF (GAUZE/BANDAGES/DRESSINGS) IMPLANT
GAUZE VASELINE 3X9 (GAUZE/BANDAGES/DRESSINGS) IMPLANT
GLOVE BIO SURGEON STRL SZ7.5 (GLOVE) ×2 IMPLANT
GLOVE INDICATOR 8.0 STRL GRN (GLOVE) ×2 IMPLANT
GOWN STRL REUS W/ TWL LRG LVL3 (GOWN DISPOSABLE) ×1 IMPLANT
GOWN STRL REUS W/TWL LRG LVL3 (GOWN DISPOSABLE) ×2
KIT RM TURNOVER CYSTO AR (KITS) ×2 IMPLANT
NDL SAFETY ECLIPSE 18X1.5 (NEEDLE) IMPLANT
NEEDLE HYPO 18GX1.5 SHARP (NEEDLE)
NEEDLE HYPO 22GX1.5 SAFETY (NEEDLE) ×2 IMPLANT
NS IRRIG 500ML POUR BTL (IV SOLUTION) ×2 IMPLANT
PACK BASIN DAY SURGERY FS (CUSTOM PROCEDURE TRAY) ×2 IMPLANT
PAD ABD 8X10 STRL (GAUZE/BANDAGES/DRESSINGS) ×2 IMPLANT
PAD ARMBOARD 7.5X6 YLW CONV (MISCELLANEOUS) ×2 IMPLANT
PENCIL BUTTON HOLSTER BLD 10FT (ELECTRODE) ×2 IMPLANT
SPONGE GAUZE 2X2 8PLY STRL LF (GAUZE/BANDAGES/DRESSINGS) ×1 IMPLANT
SPONGE LAP 18X18 X RAY DECT (DISPOSABLE) IMPLANT
SPONGE LAP 4X18 X RAY DECT (DISPOSABLE) IMPLANT
SPONGE SURGIFOAM ABS GEL 12-7 (HEMOSTASIS) IMPLANT
SUT ETHILON 2 0 PS N (SUTURE) ×2 IMPLANT
SUT VIC AB 2-0 SH 27 (SUTURE) ×2
SUT VIC AB 2-0 SH 27XBRD (SUTURE) ×1 IMPLANT
SUT VIC AB 3-0 SH 18 (SUTURE) ×2 IMPLANT
SYR BULB IRRIGATION 50ML (SYRINGE) ×2 IMPLANT
SYR CONTROL 10ML LL (SYRINGE) ×2 IMPLANT
TOWEL OR 17X24 6PK STRL BLUE (TOWEL DISPOSABLE) ×4 IMPLANT
TRAY DSU PREP LF (CUSTOM PROCEDURE TRAY) ×2 IMPLANT
TUBE CONNECTING 12X1/4 (SUCTIONS) ×2 IMPLANT
YANKAUER SUCT BULB TIP NO VENT (SUCTIONS) ×2 IMPLANT

## 2017-12-31 NOTE — Anesthesia Postprocedure Evaluation (Signed)
Anesthesia Post Note  Patient: Alexander Harper  Procedure(s) Performed: EXCISION CYST RIGHT BUTTOCK (Right Buttocks)     Patient location during evaluation: PACU Anesthesia Type: MAC Level of consciousness: awake and alert Pain management: pain level controlled Vital Signs Assessment: post-procedure vital signs reviewed and stable Respiratory status: spontaneous breathing, nonlabored ventilation, respiratory function stable and patient connected to nasal cannula oxygen Cardiovascular status: stable and blood pressure returned to baseline Postop Assessment: no apparent nausea or vomiting Anesthetic complications: no    Last Vitals:  Vitals:   12/31/17 1123 12/31/17 1130  BP: 102/74 106/72  Pulse: 74 72  Resp: 12 13  Temp: 36.6 C   SpO2: 100% 100%    Last Pain:  Vitals:   12/31/17 1010  TempSrc:   PainSc: 7                  Angala Hilgers S

## 2017-12-31 NOTE — Discharge Instructions (Addendum)
ANORECTAL SURGERY: POST OP INSTRUCTIONS  1. DIET: As tolerated   2. Take your usually prescribed home medications unless otherwise directed.  3. PAIN CONTROL: a. It is helpful to take an over-the-counter pain medication regularly for the first few days/weeks.  Choose from the following that works best for you: i. Ibuprofen (Advil, etc) Three 200mg  tabs every 6 hours as needed. ii. Acetaminophen (Tylenol, etc) 500-650mg  every 6 hours as needed iii. NOTE: You may take both of these medications together - most patients find it most helpful when alternating between the two (i.e. Ibuprofen at 6am, tylenol at 9am, ibuprofen at 12pm ...)  4. Wash / shower every day.  Your incision is covered in Dermabond (sterile superglue) - this is waterproof. It's ok to bathe normally in a shower and get soap/water over your incision starting tomorrow.  5. ACTIVITIES as tolerated:  Be careful on your feet for the next 24 hrs - after having had anesthesia, it's not uncommon to feel weak/dizzy/drowsy. Do not drive or operate heavy machinery for at least the next 24hrs. a. Avoid activities that make your pain worse b. You may drive when you are no longer taking prescription pain medication, you can comfortably wear a seatbelt, and you can safely maneuver your car and apply brakes.  6. FOLLOW UP in our office a. Please call [facility] at [phone] to set up an appointment to see your surgeon in the office for a follow-up appointment approximately 2 weeks after your surgery. b. Make sure that you call for this appointment the day you arrive home to insure a convenient appointment time.  9. If you have disability or family leave forms that need to be completed, you may have them completed by your primary care physician's office; for return to work instructions, please ask our office staff and they will be happy to assist you in obtaining this documentation   When to call us [phone]: 1. Poor pain  control 2. Reactions / problems with new medications (rash/itching, etc)  3. Fever over 101.5 F (38.5 C) 4. Inability to urinate 5. Nausea/vomiting 6. Worsening swelling or bruising 7. Continued bleeding from incision. 8. Increased pain, redness, or drainage from the incision  The clinic staff is available to answer your questions during regular business hours (8:30am-5pm).  Please dont hesitate to call and ask to speak to one of our nurses for clinical concerns.   A surgeon from [facility] is always on call at the hospitals   If you have a medical emergency, go to the nearest emergency room or call 911.   [facility], [place], [place], [place], [place]  [address] ? MAIN: [phone] FAX [phone] [email or web address]    Post Anesthesia Home Care Instructions  Activity: Get plenty of rest for the remainder of the day. A responsible individual must stay with you for 24 hours following the procedure.  For the next 24 hours, DO NOT: -Drive a car -Paediatric nurse -Drink alcoholic beverages -Take any medication unless instructed by your physician -Make any legal decisions or sign important papers.  Meals: Start with liquid foods such as gelatin or soup. Progress to regular foods as tolerated. Avoid greasy, spicy, heavy foods. If nausea and/or vomiting occur, drink only clear liquids until the nausea and/or vomiting subsides. Call your physician if vomiting continues.  Special Instructions/Symptoms: Your throat may feel dry or sore from the anesthesia or the breathing tube placed in your throat during surgery. If  this causes discomfort, gargle with warm salt water. The discomfort should disappear within 24 hours.  I

## 2017-12-31 NOTE — Anesthesia Procedure Notes (Signed)
Procedure Name: MAC Date/Time: 12/31/2017 10:52 AM Performed by: Wanita Chamberlain, CRNA Pre-anesthesia Checklist: Patient identified, Timeout performed, Emergency Drugs available, Suction available and Patient being monitored Patient Re-evaluated:Patient Re-evaluated prior to induction Oxygen Delivery Method: Nasal cannula Preoxygenation: Pre-oxygenation with 100% oxygen Induction Type: IV induction Placement Confirmation: positive ETCO2 and breath sounds checked- equal and bilateral Dental Injury: Teeth and Oropharynx as per pre-operative assessment

## 2017-12-31 NOTE — Anesthesia Preprocedure Evaluation (Addendum)
Anesthesia Evaluation  Patient identified by MRN, date of birth, ID band Patient awake    Reviewed: Allergy & Precautions, NPO status , Patient's Chart, lab work & pertinent test results  Airway Mallampati: II  TM Distance: >3 FB Neck ROM: Full    Dental no notable dental hx. (+) Teeth Intact, Dental Advisory Given,    Pulmonary Current Smoker,    Pulmonary exam normal breath sounds clear to auscultation       Cardiovascular negative cardio ROS Normal cardiovascular exam Rhythm:Regular Rate:Normal     Neuro/Psych Depression S/P insertion of spinal cord stimulator. Chronic Pain Pt/ narcotic use negative psych ROS   GI/Hepatic negative GI ROS, Neg liver ROS, (+)     (-) substance abuse  ,   Endo/Other  negative endocrine ROS  Renal/GU negative Renal ROS  negative genitourinary   Musculoskeletal negative musculoskeletal ROS (+) Arthritis , RSD right leg/ Uses a Cane to ambulate   Abdominal   Peds negative pediatric ROS (+)  Hematology negative hematology ROS (+)   Anesthesia Other Findings OD blind  Reproductive/Obstetrics negative OB ROS                          Anesthesia Physical Anesthesia Plan  ASA: II  Anesthesia Plan: MAC   Post-op Pain Management:    Induction: Intravenous  PONV Risk Score and Plan: 1 and Ondansetron and Dexamethasone  Airway Management Planned: Simple Face Mask  Additional Equipment:   Intra-op Plan:   Post-operative Plan:   Informed Consent: I have reviewed the patients History and Physical, chart, labs and discussed the procedure including the risks, benefits and alternatives for the proposed anesthesia with the patient or authorized representative who has indicated his/her understanding and acceptance.   Dental advisory given  Plan Discussed with: CRNA and Surgeon  Anesthesia Plan Comments:        Anesthesia Quick Evaluation

## 2017-12-31 NOTE — Transfer of Care (Signed)
Immediate Anesthesia Transfer of Care Note  Patient: Alexander Harper  Procedure(s) Performed: EXCISION CYST RIGHT BUTTOCK (Right Buttocks)  Patient Location: PACU  Anesthesia Type:MAC  Level of Consciousness: awake, alert , oriented and patient cooperative  Airway & Oxygen Therapy: Patient Spontanous Breathing and Patient connected to nasal cannula oxygen  Post-op Assessment: Report given to RN and Post -op Vital signs reviewed and stable  Post vital signs: Reviewed and stable  Last Vitals:  Vitals:   12/31/17 0916  BP: 108/78  Pulse: 86  Resp: 16  Temp: 37 C  SpO2: 100%    Last Pain:  Vitals:   12/31/17 1010  TempSrc:   PainSc: 7       Patients Stated Pain Goal: 6 (50/93/26 7124)  Complications: No apparent anesthesia complications

## 2017-12-31 NOTE — Op Note (Signed)
12/31/2017  11:26 AM  PATIENT:  Alexander Harper  62 y.o. male  Patient Care Team: Maury Dus, MD as PCP - General (Family Medicine)  PRE-OPERATIVE DIAGNOSIS:  Cystic lesion of right buttock  POST-OPERATIVE DIAGNOSIS:  Same  PROCEDURE:  Excision of cystic lesion of right buttock  SURGEON:  Sharon Mt. Rebacca Vot[place], MD  ASSISTANT: None  ANESTHESIA:   MAC  COUNTS:  Sponge, needle and instrument counts were reported correct x2 at the conclusion of the operation.  EBL: 1cc  DRAINS: none  SPECIMEN: Right buttock cystic lesion  FINDINGS: Erythema had resolved. Punctate opening/scab at area of right buttock cystic lesion.  This was excised and sent off for further pathologic assessment.  The lesion itself measured approximately 1 x 1 cm and involved skin and subcutaneous tissue but not fascia.  DISPOSITION: PACU in satisfactory condition  INDICATION: Mr. Cortright is a pleasant 8yoM with hx of RSD presents for evaluation of a right gluteal lesion. He first noticed this 09/2017. At that time he had pain and a red heaped up fluctuant lesion. This spontaneously drained purulent fluid. It has recurred twice. He was seen last week in the urgent office for this problem. He was recently prescribed a course of doxycycline and had noted dramatic improvement. He denies a history of subcutaneous abscesses or cysts in the past.  On exam he had a right gluteal skin lesion 1 x 1 cm with central umbilication consistent with a possible epidermoid/sebaceous cyst.  Given this and his desires surgical excision was discussed.  The relevant anatomy and physiology of the right buttock was discussed.  The pathophysiology of cystic skin lesions and potential for underlying infection of these and subsequent recurrence was discussed. The planned procedure, material risks (including, but not limited to, pain, bleeding, infection, scarring, recurrence, need for additional procedures, recurrence) benefits and alternatives  to surgery were discussed at length. I noted a good probability that the procedure would help improve their symptoms. His questions were answered to his satisfaction and he elected to proceed with surgery.  DESCRIPTION: The patient was identified in preop holding and taken to the OR where they were placed on the operating room table and SCDs were placed and the patient was positioned in the left lateral decubitus position.  MAC was administered. The patient was then prepped and draped in the usual sterile fashion. A surgical timeout was performed indicating the correct patient, procedure, positioning and need for preoperative antibiotics.  The lesion was identified on the right buttock.  Local anesthetic consisting of Exparel+0.25% Marcaine with epinephrine was infiltrated into the skin and subcutaneous tissue around the lesion. An elliptical incision was created around this incorporating nothing but healthy tissue and skin.  This was carried down to the subcutaneous fat circumferentially and staying out of the likely cyst wall.  This was then passed off as specimen.  The wound was then irrigated and hemostasis verified.  There is no surrounding erythema or signs of infection.  Given this, the decision was made to close the wound.  Interrupted 3-0 Vicryl deep dermal sutures were used to close the dead space.  The skin was then closed with a running 4-0 Monocryl subcuticular suture.  The incision was rinsed and covered with Dermabond.  The patient was then awakened from [facility] and transferred to a stretcher for transport to PACU in satisfactory condition   Note: This dictation was prepared with Dragon/digital dictation along with [employer]. Any transcriptional errors that result from this process are unintentional.

## 2017-12-31 NOTE — H&P (Signed)
H&P Update  The H&P from 12/22/17 was reviewed with the patient and he reports no interval changes to his history. Feeling well today.  Vitals:   12/23/17 0927 12/31/17 0916  BP:  108/78  Pulse:  86  Resp:  16  Temp:  98.6 F (37 C)  TempSrc:  Oral  SpO2:  100%  Weight: 77.1 kg (170 lb) 75.3 kg (166 lb 1.6 oz)  Height: 6\' 5"  (1.956 m)    GEN: NAD, comfortable CV: RRR  PLAN 61yoM with recurrent abscess of right buttock now drained and resolving - likely has a sebaceous cyst in this region. -The pathophysiology of sebaceous/epidermoid cysts was discussed at length with the patient. -Will plan excision of sebaceous cyst of right buttock in OR; we discussed possibility of leaving the wound open if there is signs of cellulitis/infection at the time of the procedure. -The planned procedure, material risks (including, but not limited to, pain, bleeding, infection, scarring, recurrence, need for additional procedures, hematoma, seroma, recurrence) benefits and alternatives to surgery were discussed at length. I noted a good probability that the procedure would help improve their symptoms. His questions were answered to his satisfaction and he elected to proceed with surgery.

## 2018-01-01 ENCOUNTER — Encounter (HOSPITAL_BASED_OUTPATIENT_CLINIC_OR_DEPARTMENT_OTHER): Payer: Self-pay | Admitting: Surgery

## 2018-12-10 DIAGNOSIS — M1712 Unilateral primary osteoarthritis, left knee: Secondary | ICD-10-CM | POA: Diagnosis not present

## 2018-12-17 DIAGNOSIS — M1712 Unilateral primary osteoarthritis, left knee: Secondary | ICD-10-CM | POA: Diagnosis not present

## 2018-12-24 DIAGNOSIS — M1712 Unilateral primary osteoarthritis, left knee: Secondary | ICD-10-CM | POA: Diagnosis not present

## 2019-01-06 IMAGING — CT CT KNEE*L* W/CM
2 series · 14 of 27 positions shown, 18 images · non-contrast
Comparison: None.

CLINICAL DATA: Chronic knee pain.

EXAM:
CT ARTHROGRAPHY OF THE left knee
TECHNIQUE: Multidetector CT imaging was performed following the standard
protocol after injection of dilute contrast into the joint.

[Series 4: soft tissue lower extremity · axial · 0.37mm/px · z∈[-258,-82]mm · 9 of 105 slices shown, 12 images]
[im 9/105  soft-tissue]
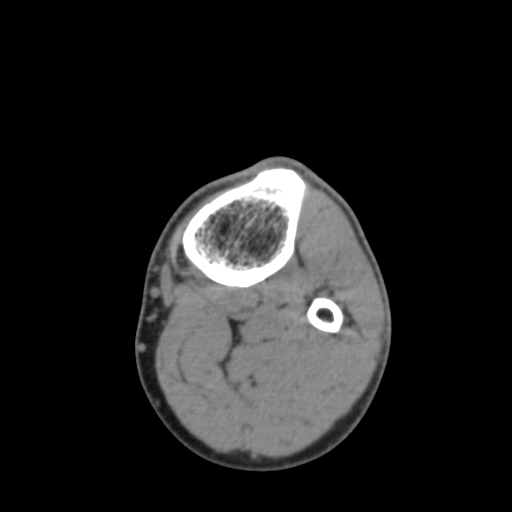
[im 9/105  bone]
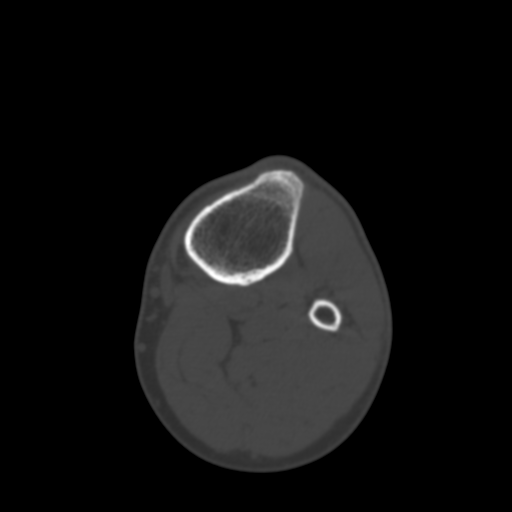
[im 25/105  bone]
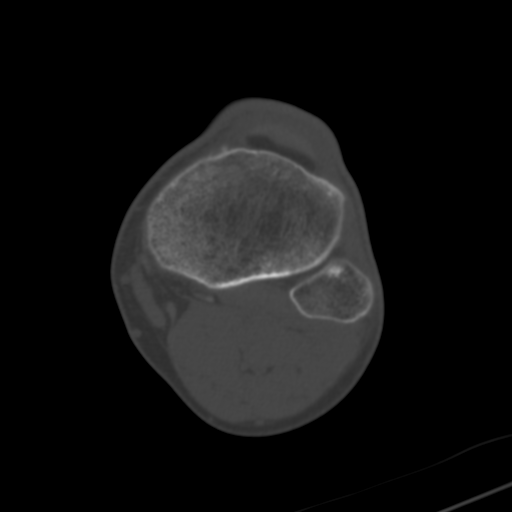
[im 33/105  bone]
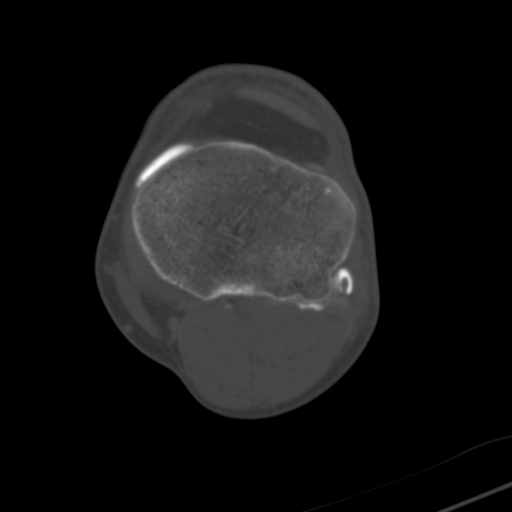
[im 41/105  bone]
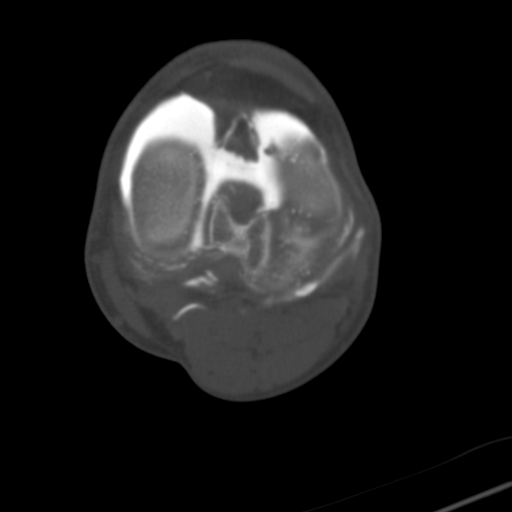
[im 57/105  soft-tissue]
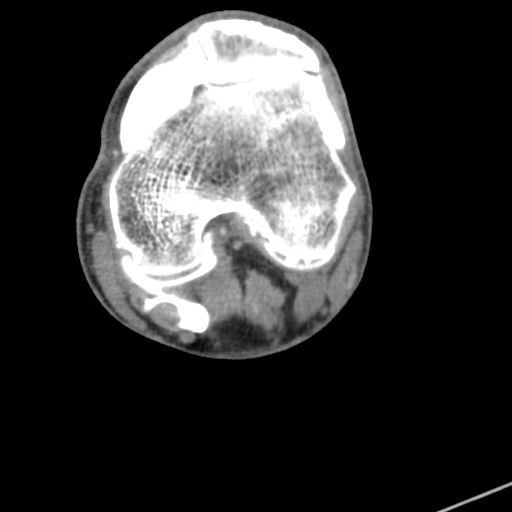
[im 57/105  bone]
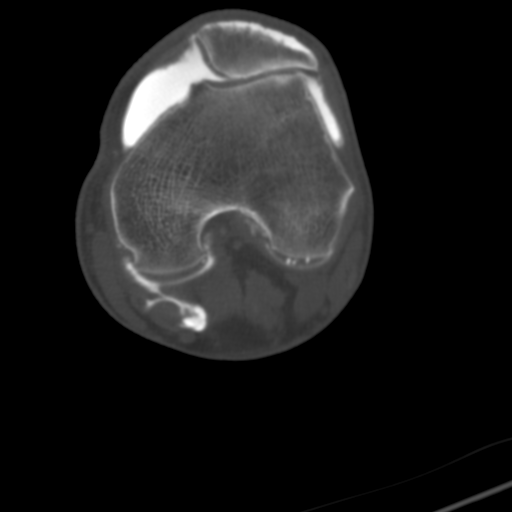
[im 65/105  bone]
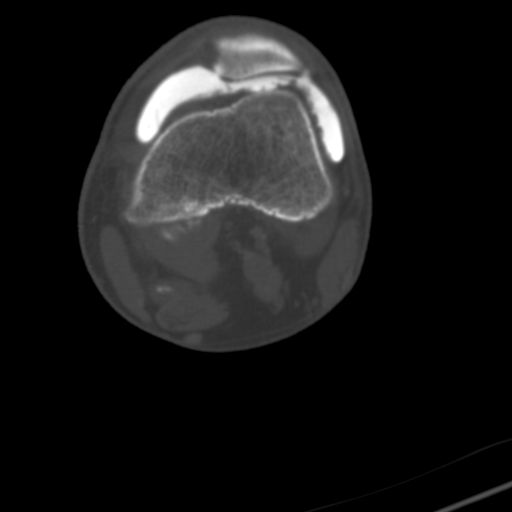
[im 73/105  bone]
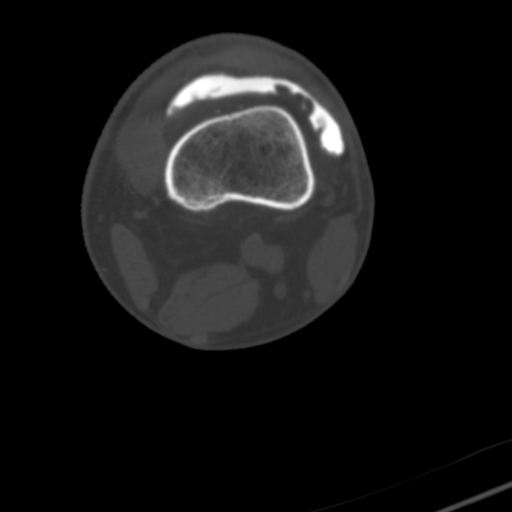
[im 89/105  bone]
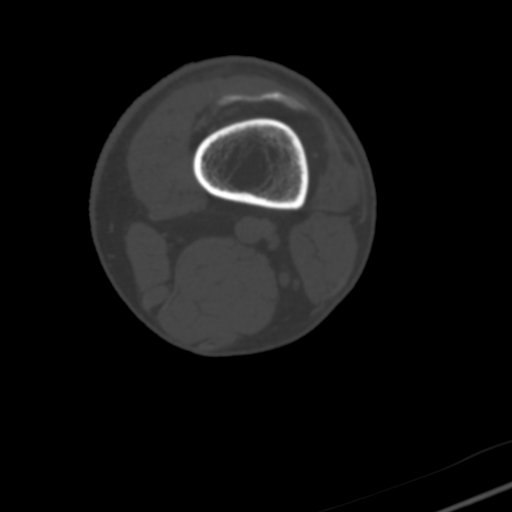
[im 97/105  soft-tissue]
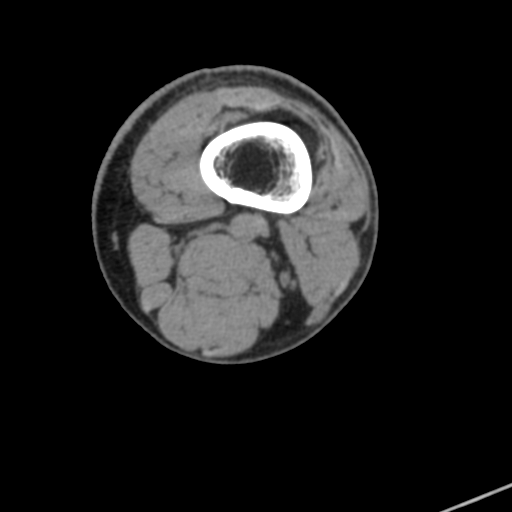
[im 97/105  bone]
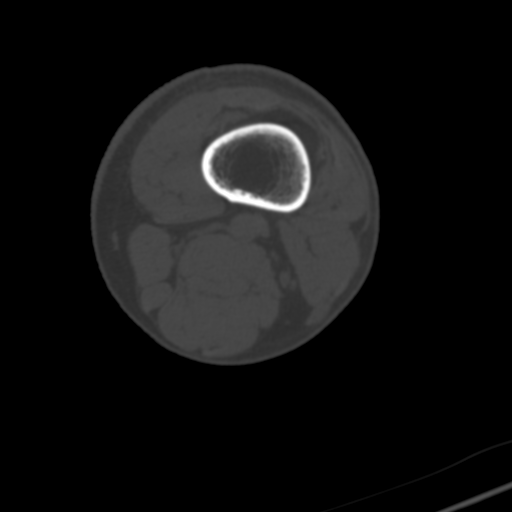

[Series 10: sagsoft tissue · sagittal · 0.36mm/px · 5 of 77 slices shown, 6 images]
[im 26/77  bone]
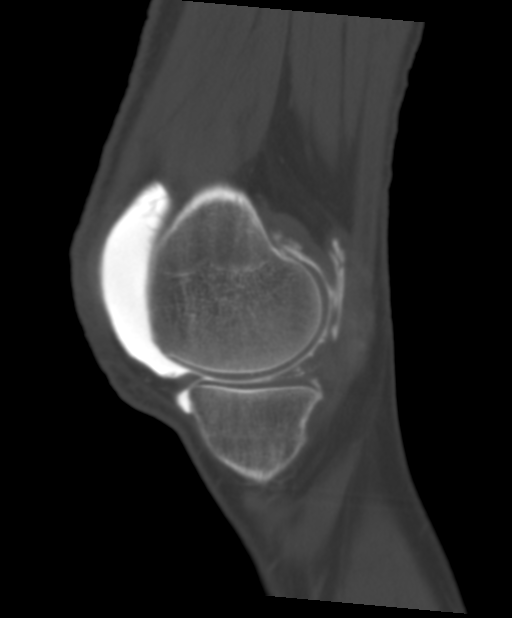
[im 32/77  bone]
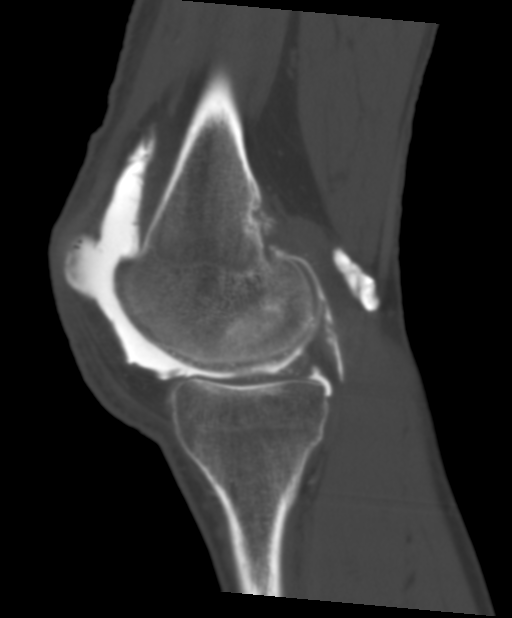
[im 39/77  soft-tissue]
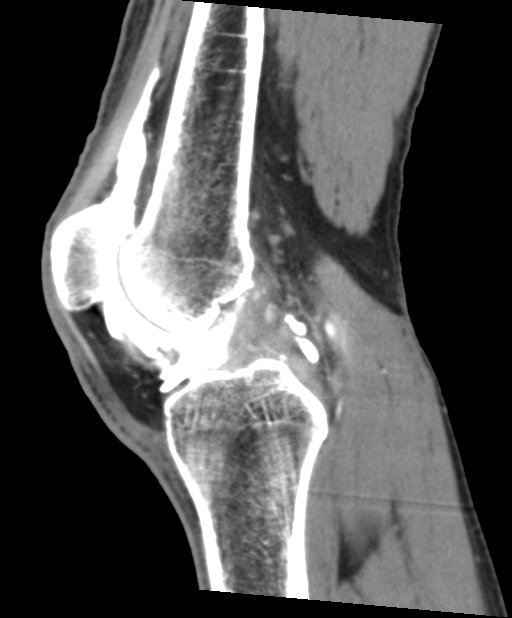
[im 39/77  bone]
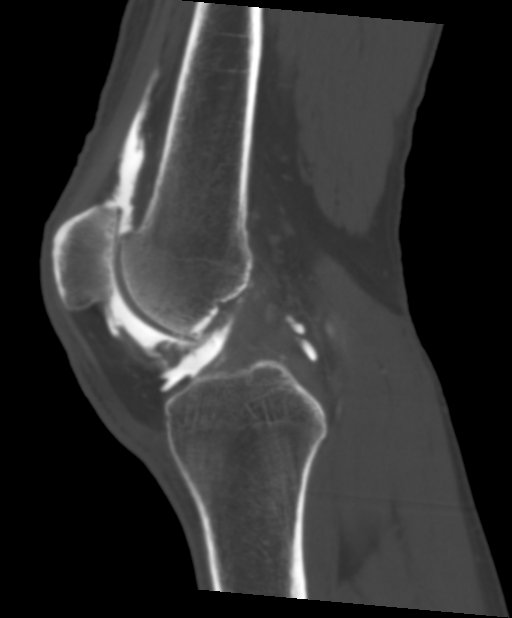
[im 45/77  bone]
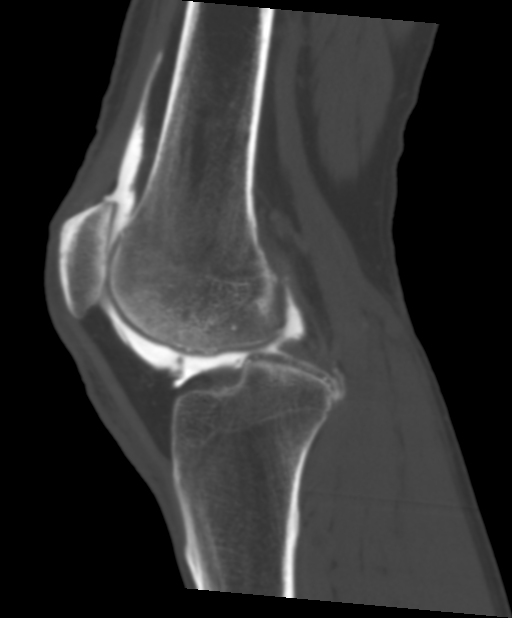
[im 51/77  bone]
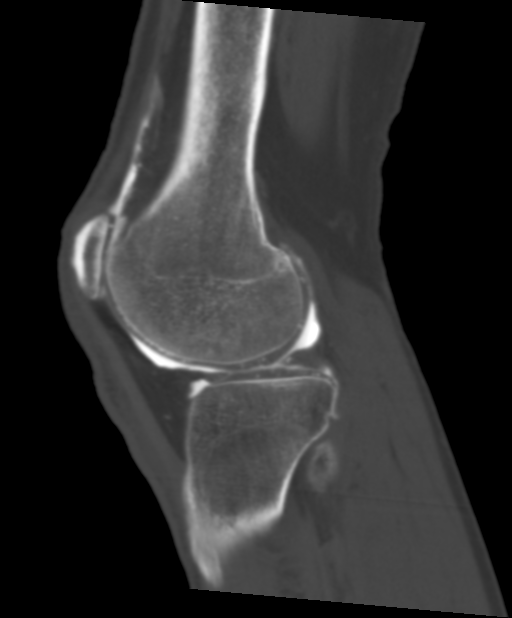

[14 of 27 positions shown; findings below may reference images not displayed]

FINDINGS: Tricompartmental degenerative changes most significant in the
patellofemoral joint where there are areas of full-thickness
cartilage loss and subchondral cystic change (grade 4
chondromalacia) mainly involving the patellar apex and lateral
facet. Mild degenerative chondrosis involving the medial and lateral
compartments but no cartilage defects or osteochondral lesion. There
is extensive chondrocalcinosis involving the articular cartilage and
menisci along with some areas of synovial thickening and
calcification. Findings suggest CPPD arthropathy.

The cruciate and collateral ligaments appear grossly intact by CT.
The quadriceps and patellar tendons are intact.

Extensive chondrocalcinosis involving the menisci along with
degenerative changes and would appear to be free edge fraying and
fibrillation. I do not see a discrete meniscus tear.

Small Baker's cysts is noted.
IMPRESSION: 1. Tricompartmental degenerative changes, synovitis and
chondrocalcinosis consistent with CPPD arthropathy. This is most
significant at the patellofemoral joint where there are areas of
grade 4 chondromalacia.
2. Grossly ligamentous structures and no acute bony findings.
3. Meniscal degenerative changes and chondrocalcinosis but no
obvious meniscal tear.

## 2019-01-08 DIAGNOSIS — M549 Dorsalgia, unspecified: Secondary | ICD-10-CM | POA: Diagnosis not present

## 2019-01-08 DIAGNOSIS — G90523 Complex regional pain syndrome I of lower limb, bilateral: Secondary | ICD-10-CM | POA: Diagnosis not present

## 2019-04-01 DIAGNOSIS — M549 Dorsalgia, unspecified: Secondary | ICD-10-CM | POA: Diagnosis not present

## 2019-04-01 DIAGNOSIS — Z716 Tobacco abuse counseling: Secondary | ICD-10-CM | POA: Diagnosis not present

## 2019-04-01 DIAGNOSIS — G90523 Complex regional pain syndrome I of lower limb, bilateral: Secondary | ICD-10-CM | POA: Diagnosis not present

## 2019-04-01 DIAGNOSIS — Z720 Tobacco use: Secondary | ICD-10-CM | POA: Diagnosis not present

## 2019-05-20 DIAGNOSIS — Z716 Tobacco abuse counseling: Secondary | ICD-10-CM | POA: Diagnosis not present

## 2019-05-20 DIAGNOSIS — G90523 Complex regional pain syndrome I of lower limb, bilateral: Secondary | ICD-10-CM | POA: Diagnosis not present

## 2019-05-20 DIAGNOSIS — Z720 Tobacco use: Secondary | ICD-10-CM | POA: Diagnosis not present

## 2019-05-20 DIAGNOSIS — M549 Dorsalgia, unspecified: Secondary | ICD-10-CM | POA: Diagnosis not present

## 2019-06-22 DIAGNOSIS — M549 Dorsalgia, unspecified: Secondary | ICD-10-CM | POA: Diagnosis not present

## 2019-06-22 DIAGNOSIS — G894 Chronic pain syndrome: Secondary | ICD-10-CM | POA: Diagnosis not present

## 2019-06-22 DIAGNOSIS — Z5181 Encounter for therapeutic drug level monitoring: Secondary | ICD-10-CM | POA: Diagnosis not present

## 2019-06-22 DIAGNOSIS — G90523 Complex regional pain syndrome I of lower limb, bilateral: Secondary | ICD-10-CM | POA: Diagnosis not present

## 2019-06-22 DIAGNOSIS — Z716 Tobacco abuse counseling: Secondary | ICD-10-CM | POA: Diagnosis not present

## 2019-06-22 DIAGNOSIS — Z79899 Other long term (current) drug therapy: Secondary | ICD-10-CM | POA: Diagnosis not present

## 2019-09-15 DIAGNOSIS — N401 Enlarged prostate with lower urinary tract symptoms: Secondary | ICD-10-CM | POA: Diagnosis not present

## 2019-09-20 DIAGNOSIS — Z716 Tobacco abuse counseling: Secondary | ICD-10-CM | POA: Diagnosis not present

## 2019-09-20 DIAGNOSIS — G90521 Complex regional pain syndrome I of right lower limb: Secondary | ICD-10-CM | POA: Diagnosis not present

## 2019-09-20 DIAGNOSIS — T85192D Other mechanical complication of implanted electronic neurostimulator (electrode) of spinal cord, subsequent encounter: Secondary | ICD-10-CM | POA: Diagnosis not present

## 2019-09-20 DIAGNOSIS — M549 Dorsalgia, unspecified: Secondary | ICD-10-CM | POA: Diagnosis not present

## 2019-09-22 DIAGNOSIS — N401 Enlarged prostate with lower urinary tract symptoms: Secondary | ICD-10-CM | POA: Diagnosis not present

## 2019-09-22 DIAGNOSIS — N5201 Erectile dysfunction due to arterial insufficiency: Secondary | ICD-10-CM | POA: Diagnosis not present

## 2019-09-22 DIAGNOSIS — R3121 Asymptomatic microscopic hematuria: Secondary | ICD-10-CM | POA: Diagnosis not present

## 2019-09-22 DIAGNOSIS — R351 Nocturia: Secondary | ICD-10-CM | POA: Diagnosis not present

## 2019-12-13 DIAGNOSIS — T85192D Other mechanical complication of implanted electronic neurostimulator (electrode) of spinal cord, subsequent encounter: Secondary | ICD-10-CM | POA: Diagnosis not present

## 2019-12-13 DIAGNOSIS — M549 Dorsalgia, unspecified: Secondary | ICD-10-CM | POA: Diagnosis not present

## 2019-12-13 DIAGNOSIS — G90523 Complex regional pain syndrome I of lower limb, bilateral: Secondary | ICD-10-CM | POA: Diagnosis not present

## 2020-02-07 DIAGNOSIS — G90523 Complex regional pain syndrome I of lower limb, bilateral: Secondary | ICD-10-CM | POA: Diagnosis not present

## 2020-02-07 DIAGNOSIS — M549 Dorsalgia, unspecified: Secondary | ICD-10-CM | POA: Diagnosis not present

## 2020-02-07 DIAGNOSIS — Z720 Tobacco use: Secondary | ICD-10-CM | POA: Diagnosis not present

## 2020-02-07 DIAGNOSIS — Z79899 Other long term (current) drug therapy: Secondary | ICD-10-CM | POA: Diagnosis not present

## 2020-02-07 DIAGNOSIS — Z5181 Encounter for therapeutic drug level monitoring: Secondary | ICD-10-CM | POA: Diagnosis not present

## 2020-02-07 DIAGNOSIS — G90521 Complex regional pain syndrome I of right lower limb: Secondary | ICD-10-CM | POA: Diagnosis not present

## 2020-03-02 DIAGNOSIS — L84 Corns and callosities: Secondary | ICD-10-CM | POA: Diagnosis not present

## 2020-03-02 DIAGNOSIS — K580 Irritable bowel syndrome with diarrhea: Secondary | ICD-10-CM | POA: Diagnosis not present

## 2020-03-02 DIAGNOSIS — N529 Male erectile dysfunction, unspecified: Secondary | ICD-10-CM | POA: Diagnosis not present

## 2020-03-02 DIAGNOSIS — D171 Benign lipomatous neoplasm of skin and subcutaneous tissue of trunk: Secondary | ICD-10-CM | POA: Diagnosis not present

## 2020-03-02 DIAGNOSIS — Z1211 Encounter for screening for malignant neoplasm of colon: Secondary | ICD-10-CM | POA: Diagnosis not present

## 2020-05-01 DIAGNOSIS — G894 Chronic pain syndrome: Secondary | ICD-10-CM | POA: Diagnosis not present

## 2020-05-01 DIAGNOSIS — M549 Dorsalgia, unspecified: Secondary | ICD-10-CM | POA: Diagnosis not present

## 2020-05-01 DIAGNOSIS — Z716 Tobacco abuse counseling: Secondary | ICD-10-CM | POA: Diagnosis not present

## 2020-05-01 DIAGNOSIS — G90521 Complex regional pain syndrome I of right lower limb: Secondary | ICD-10-CM | POA: Diagnosis not present

## 2020-06-01 DIAGNOSIS — K580 Irritable bowel syndrome with diarrhea: Secondary | ICD-10-CM | POA: Diagnosis not present

## 2020-06-01 DIAGNOSIS — Z0000 Encounter for general adult medical examination without abnormal findings: Secondary | ICD-10-CM | POA: Diagnosis not present

## 2020-06-01 DIAGNOSIS — Z6821 Body mass index (BMI) 21.0-21.9, adult: Secondary | ICD-10-CM | POA: Diagnosis not present

## 2020-06-01 DIAGNOSIS — D171 Benign lipomatous neoplasm of skin and subcutaneous tissue of trunk: Secondary | ICD-10-CM | POA: Diagnosis not present

## 2020-06-01 DIAGNOSIS — S61219A Laceration without foreign body of unspecified finger without damage to nail, initial encounter: Secondary | ICD-10-CM | POA: Diagnosis not present

## 2020-06-01 DIAGNOSIS — Z136 Encounter for screening for cardiovascular disorders: Secondary | ICD-10-CM | POA: Diagnosis not present

## 2020-06-01 DIAGNOSIS — Z23 Encounter for immunization: Secondary | ICD-10-CM | POA: Diagnosis not present

## 2020-06-01 DIAGNOSIS — F17210 Nicotine dependence, cigarettes, uncomplicated: Secondary | ICD-10-CM | POA: Diagnosis not present

## 2020-06-01 DIAGNOSIS — N529 Male erectile dysfunction, unspecified: Secondary | ICD-10-CM | POA: Diagnosis not present

## 2020-07-28 DIAGNOSIS — G894 Chronic pain syndrome: Secondary | ICD-10-CM | POA: Diagnosis not present

## 2020-07-28 DIAGNOSIS — Z716 Tobacco abuse counseling: Secondary | ICD-10-CM | POA: Diagnosis not present

## 2020-07-28 DIAGNOSIS — Z5181 Encounter for therapeutic drug level monitoring: Secondary | ICD-10-CM | POA: Diagnosis not present

## 2020-07-28 DIAGNOSIS — G90523 Complex regional pain syndrome I of lower limb, bilateral: Secondary | ICD-10-CM | POA: Diagnosis not present

## 2020-07-28 DIAGNOSIS — M549 Dorsalgia, unspecified: Secondary | ICD-10-CM | POA: Diagnosis not present

## 2020-07-28 DIAGNOSIS — Z79899 Other long term (current) drug therapy: Secondary | ICD-10-CM | POA: Diagnosis not present

## 2020-09-20 DIAGNOSIS — N401 Enlarged prostate with lower urinary tract symptoms: Secondary | ICD-10-CM | POA: Diagnosis not present

## 2020-09-27 DIAGNOSIS — R351 Nocturia: Secondary | ICD-10-CM | POA: Diagnosis not present

## 2020-09-27 DIAGNOSIS — Z87442 Personal history of urinary calculi: Secondary | ICD-10-CM | POA: Diagnosis not present

## 2020-09-27 DIAGNOSIS — N401 Enlarged prostate with lower urinary tract symptoms: Secondary | ICD-10-CM | POA: Diagnosis not present

## 2020-09-27 DIAGNOSIS — R3121 Asymptomatic microscopic hematuria: Secondary | ICD-10-CM | POA: Diagnosis not present

## 2020-09-27 DIAGNOSIS — N5201 Erectile dysfunction due to arterial insufficiency: Secondary | ICD-10-CM | POA: Diagnosis not present

## 2020-10-20 DIAGNOSIS — T85192D Other mechanical complication of implanted electronic neurostimulator (electrode) of spinal cord, subsequent encounter: Secondary | ICD-10-CM | POA: Diagnosis not present

## 2020-10-20 DIAGNOSIS — M549 Dorsalgia, unspecified: Secondary | ICD-10-CM | POA: Diagnosis not present

## 2020-10-20 DIAGNOSIS — G894 Chronic pain syndrome: Secondary | ICD-10-CM | POA: Diagnosis not present

## 2020-10-20 DIAGNOSIS — G90523 Complex regional pain syndrome I of lower limb, bilateral: Secondary | ICD-10-CM | POA: Diagnosis not present

## 2020-12-05 DIAGNOSIS — S61211A Laceration without foreign body of left index finger without damage to nail, initial encounter: Secondary | ICD-10-CM | POA: Diagnosis not present

## 2020-12-12 DIAGNOSIS — S61211A Laceration without foreign body of left index finger without damage to nail, initial encounter: Secondary | ICD-10-CM | POA: Diagnosis not present

## 2021-01-12 DIAGNOSIS — Z720 Tobacco use: Secondary | ICD-10-CM | POA: Diagnosis not present

## 2021-01-12 DIAGNOSIS — Z79899 Other long term (current) drug therapy: Secondary | ICD-10-CM | POA: Diagnosis not present

## 2021-01-12 DIAGNOSIS — T85192D Other mechanical complication of implanted electronic neurostimulator (electrode) of spinal cord, subsequent encounter: Secondary | ICD-10-CM | POA: Diagnosis not present

## 2021-01-12 DIAGNOSIS — Z5181 Encounter for therapeutic drug level monitoring: Secondary | ICD-10-CM | POA: Diagnosis not present

## 2021-01-12 DIAGNOSIS — Z716 Tobacco abuse counseling: Secondary | ICD-10-CM | POA: Diagnosis not present

## 2021-01-12 DIAGNOSIS — M549 Dorsalgia, unspecified: Secondary | ICD-10-CM | POA: Diagnosis not present

## 2021-01-12 DIAGNOSIS — G894 Chronic pain syndrome: Secondary | ICD-10-CM | POA: Diagnosis not present

## 2021-01-12 DIAGNOSIS — G90523 Complex regional pain syndrome I of lower limb, bilateral: Secondary | ICD-10-CM | POA: Diagnosis not present

## 2021-01-12 DIAGNOSIS — G90521 Complex regional pain syndrome I of right lower limb: Secondary | ICD-10-CM | POA: Diagnosis not present

## 2021-04-05 DIAGNOSIS — Z720 Tobacco use: Secondary | ICD-10-CM | POA: Diagnosis not present

## 2021-04-05 DIAGNOSIS — T85192D Other mechanical complication of implanted electronic neurostimulator (electrode) of spinal cord, subsequent encounter: Secondary | ICD-10-CM | POA: Diagnosis not present

## 2021-04-05 DIAGNOSIS — G894 Chronic pain syndrome: Secondary | ICD-10-CM | POA: Diagnosis not present

## 2021-04-05 DIAGNOSIS — Z716 Tobacco abuse counseling: Secondary | ICD-10-CM | POA: Diagnosis not present

## 2021-04-05 DIAGNOSIS — G90521 Complex regional pain syndrome I of right lower limb: Secondary | ICD-10-CM | POA: Diagnosis not present

## 2021-04-05 DIAGNOSIS — M549 Dorsalgia, unspecified: Secondary | ICD-10-CM | POA: Diagnosis not present

## 2021-04-05 DIAGNOSIS — G90523 Complex regional pain syndrome I of lower limb, bilateral: Secondary | ICD-10-CM | POA: Diagnosis not present

## 2021-05-08 DIAGNOSIS — M546 Pain in thoracic spine: Secondary | ICD-10-CM | POA: Diagnosis not present

## 2021-05-08 DIAGNOSIS — G894 Chronic pain syndrome: Secondary | ICD-10-CM | POA: Diagnosis not present

## 2021-05-08 DIAGNOSIS — G90521 Complex regional pain syndrome I of right lower limb: Secondary | ICD-10-CM | POA: Diagnosis not present

## 2021-05-29 DIAGNOSIS — G90521 Complex regional pain syndrome I of right lower limb: Secondary | ICD-10-CM | POA: Diagnosis not present

## 2021-05-29 DIAGNOSIS — M546 Pain in thoracic spine: Secondary | ICD-10-CM | POA: Diagnosis not present

## 2021-05-29 DIAGNOSIS — G894 Chronic pain syndrome: Secondary | ICD-10-CM | POA: Diagnosis not present

## 2021-06-05 DIAGNOSIS — D171 Benign lipomatous neoplasm of skin and subcutaneous tissue of trunk: Secondary | ICD-10-CM | POA: Diagnosis not present

## 2021-06-05 DIAGNOSIS — Z0000 Encounter for general adult medical examination without abnormal findings: Secondary | ICD-10-CM | POA: Diagnosis not present

## 2021-06-05 DIAGNOSIS — K580 Irritable bowel syndrome with diarrhea: Secondary | ICD-10-CM | POA: Diagnosis not present

## 2021-06-05 DIAGNOSIS — Z1389 Encounter for screening for other disorder: Secondary | ICD-10-CM | POA: Diagnosis not present

## 2021-06-05 DIAGNOSIS — N529 Male erectile dysfunction, unspecified: Secondary | ICD-10-CM | POA: Diagnosis not present

## 2021-06-05 DIAGNOSIS — F17210 Nicotine dependence, cigarettes, uncomplicated: Secondary | ICD-10-CM | POA: Diagnosis not present

## 2021-06-21 DIAGNOSIS — K648 Other hemorrhoids: Secondary | ICD-10-CM | POA: Diagnosis not present

## 2021-06-21 DIAGNOSIS — K5730 Diverticulosis of large intestine without perforation or abscess without bleeding: Secondary | ICD-10-CM | POA: Diagnosis not present

## 2021-06-21 DIAGNOSIS — Z1211 Encounter for screening for malignant neoplasm of colon: Secondary | ICD-10-CM | POA: Diagnosis not present

## 2021-06-21 DIAGNOSIS — R197 Diarrhea, unspecified: Secondary | ICD-10-CM | POA: Diagnosis not present

## 2021-06-21 DIAGNOSIS — K635 Polyp of colon: Secondary | ICD-10-CM | POA: Diagnosis not present

## 2021-06-26 DIAGNOSIS — K635 Polyp of colon: Secondary | ICD-10-CM | POA: Diagnosis not present

## 2021-08-30 DIAGNOSIS — G90521 Complex regional pain syndrome I of right lower limb: Secondary | ICD-10-CM | POA: Diagnosis not present

## 2021-09-19 DIAGNOSIS — N401 Enlarged prostate with lower urinary tract symptoms: Secondary | ICD-10-CM | POA: Diagnosis not present

## 2021-09-26 DIAGNOSIS — N5201 Erectile dysfunction due to arterial insufficiency: Secondary | ICD-10-CM | POA: Diagnosis not present

## 2021-09-26 DIAGNOSIS — R350 Frequency of micturition: Secondary | ICD-10-CM | POA: Diagnosis not present

## 2021-09-26 DIAGNOSIS — R3121 Asymptomatic microscopic hematuria: Secondary | ICD-10-CM | POA: Diagnosis not present

## 2021-09-26 DIAGNOSIS — N401 Enlarged prostate with lower urinary tract symptoms: Secondary | ICD-10-CM | POA: Diagnosis not present

## 2021-11-29 DIAGNOSIS — Z9689 Presence of other specified functional implants: Secondary | ICD-10-CM | POA: Diagnosis not present

## 2021-11-29 DIAGNOSIS — F1120 Opioid dependence, uncomplicated: Secondary | ICD-10-CM | POA: Diagnosis not present

## 2021-11-29 DIAGNOSIS — G90521 Complex regional pain syndrome I of right lower limb: Secondary | ICD-10-CM | POA: Diagnosis not present

## 2021-11-29 DIAGNOSIS — G894 Chronic pain syndrome: Secondary | ICD-10-CM | POA: Diagnosis not present

## 2022-02-14 DIAGNOSIS — Z9689 Presence of other specified functional implants: Secondary | ICD-10-CM | POA: Diagnosis not present

## 2022-02-14 DIAGNOSIS — F1120 Opioid dependence, uncomplicated: Secondary | ICD-10-CM | POA: Diagnosis not present

## 2022-02-14 DIAGNOSIS — G90521 Complex regional pain syndrome I of right lower limb: Secondary | ICD-10-CM | POA: Diagnosis not present

## 2022-02-14 DIAGNOSIS — G894 Chronic pain syndrome: Secondary | ICD-10-CM | POA: Diagnosis not present

## 2022-04-25 DIAGNOSIS — Z9689 Presence of other specified functional implants: Secondary | ICD-10-CM | POA: Diagnosis not present

## 2022-04-25 DIAGNOSIS — G90521 Complex regional pain syndrome I of right lower limb: Secondary | ICD-10-CM | POA: Diagnosis not present

## 2022-04-25 DIAGNOSIS — F1120 Opioid dependence, uncomplicated: Secondary | ICD-10-CM | POA: Diagnosis not present

## 2022-06-24 DIAGNOSIS — K580 Irritable bowel syndrome with diarrhea: Secondary | ICD-10-CM | POA: Diagnosis not present

## 2022-06-24 DIAGNOSIS — D171 Benign lipomatous neoplasm of skin and subcutaneous tissue of trunk: Secondary | ICD-10-CM | POA: Diagnosis not present

## 2022-06-24 DIAGNOSIS — Z1331 Encounter for screening for depression: Secondary | ICD-10-CM | POA: Diagnosis not present

## 2022-06-24 DIAGNOSIS — Z136 Encounter for screening for cardiovascular disorders: Secondary | ICD-10-CM | POA: Diagnosis not present

## 2022-06-24 DIAGNOSIS — G90529 Complex regional pain syndrome I of unspecified lower limb: Secondary | ICD-10-CM | POA: Diagnosis not present

## 2022-06-24 DIAGNOSIS — Z0000 Encounter for general adult medical examination without abnormal findings: Secondary | ICD-10-CM | POA: Diagnosis not present

## 2022-06-24 DIAGNOSIS — F17210 Nicotine dependence, cigarettes, uncomplicated: Secondary | ICD-10-CM | POA: Diagnosis not present

## 2022-06-24 DIAGNOSIS — N529 Male erectile dysfunction, unspecified: Secondary | ICD-10-CM | POA: Diagnosis not present

## 2022-06-25 ENCOUNTER — Other Ambulatory Visit: Payer: Self-pay | Admitting: Family Medicine

## 2022-06-25 DIAGNOSIS — Z136 Encounter for screening for cardiovascular disorders: Secondary | ICD-10-CM

## 2022-07-02 ENCOUNTER — Ambulatory Visit
Admission: RE | Admit: 2022-07-02 | Discharge: 2022-07-02 | Disposition: A | Discharge status: Home / Self Care | Payer: PPO | Source: Ambulatory Visit | Attending: Family Medicine | Admitting: Family Medicine

## 2022-07-02 DIAGNOSIS — Z136 Encounter for screening for cardiovascular disorders: Secondary | ICD-10-CM | POA: Diagnosis not present

## 2022-07-04 DIAGNOSIS — F1120 Opioid dependence, uncomplicated: Secondary | ICD-10-CM | POA: Diagnosis not present

## 2022-07-04 DIAGNOSIS — G90521 Complex regional pain syndrome I of right lower limb: Secondary | ICD-10-CM | POA: Diagnosis not present

## 2022-07-04 DIAGNOSIS — Z9689 Presence of other specified functional implants: Secondary | ICD-10-CM | POA: Diagnosis not present

## 2022-08-21 ENCOUNTER — Other Ambulatory Visit: Payer: Self-pay

## 2022-08-21 DIAGNOSIS — Z87891 Personal history of nicotine dependence: Secondary | ICD-10-CM

## 2022-08-21 DIAGNOSIS — F17210 Nicotine dependence, cigarettes, uncomplicated: Secondary | ICD-10-CM

## 2022-08-21 DIAGNOSIS — Z122 Encounter for screening for malignant neoplasm of respiratory organs: Secondary | ICD-10-CM

## 2022-09-18 ENCOUNTER — Encounter: Payer: Self-pay | Admitting: Acute Care

## 2022-09-18 ENCOUNTER — Ambulatory Visit (INDEPENDENT_AMBULATORY_CARE_PROVIDER_SITE_OTHER): Payer: PPO | Admitting: Acute Care

## 2022-09-18 DIAGNOSIS — F17210 Nicotine dependence, cigarettes, uncomplicated: Secondary | ICD-10-CM

## 2022-09-18 NOTE — Patient Instructions (Signed)
Thank you for participating in the [facility]. It was our pleasure to meet you today. We will call you with the results of your scan within the next few days. Your scan will be assigned a Lung RADS category score by the physicians reading the scans.  This Lung RADS score determines follow up scanning.  See below for description of categories, and follow up screening recommendations. We will be in touch to schedule your follow up screening annually or based on recommendations of our providers. We will fax a copy of your scan results to your Primary Care Physician, or the physician who referred you to the program, to ensure they have the results. Please call the office if you have any questions or concerns regarding your scanning experience or results.  Our office number is [phone]. Please speak with Denise Phelps, RN. , or  Denise Buckner RN, They are  our Lung Cancer Screening RN.'s If They are unavailable when you call, Please leave a message on the voice mail. We will return your call at our earliest convenience.This voice mail is monitored several times a day.  Remember, if your scan is normal, we will scan you annually as long as you continue to meet the criteria for the program. (Age 55-77, Current smoker or smoker who has quit within the last 15 years). If you are a smoker, remember, quitting is the single most powerful action that you can take to decrease your risk of lung cancer and other pulmonary, breathing related problems. We know quitting is hard, and we are here to help.  Please let us know if there is anything we can do to help you meet your goal of quitting. If you are a former smoker, congratulations. We are proud of you! Remain smoke free! Remember you can refer friends or family members through the number above.  We will screen them to make sure they meet criteria for the program. Thank you for helping us take better care of you by  participating in Lung Screening.  You can receive free nicotine replacement therapy ( patches, gum or mints) by calling 1-800-QUIT NOW. Please call so we can get you on the path to becoming  a non-smoker. I know it is hard, but you can do this!  Lung RADS Categories:  Lung RADS 1: no nodules or definitely non-concerning nodules.  Recommendation is for a repeat annual scan in 12 months.  Lung RADS 2:  nodules that are non-concerning in appearance and behavior with a very low likelihood of becoming an active cancer. Recommendation is for a repeat annual scan in 12 months.  Lung RADS 3: nodules that are probably non-concerning , includes nodules with a low likelihood of becoming an active cancer.  Recommendation is for a 6-month repeat screening scan. Often noted after an upper respiratory illness. We will be in touch to make sure you have no questions, and to schedule your 6-month scan.  Lung RADS 4 A: nodules with concerning findings, recommendation is most often for a follow up scan in 3 months or additional testing based on our provider's assessment of the scan. We will be in touch to make sure you have no questions and to schedule the recommended 3 month follow up scan.  Lung RADS 4 B:  indicates findings that are concerning. We will be in touch with you to schedule additional diagnostic testing based on our provider's  assessment of the scan.  Other options for assistance in smoking cessation (   As covered by your insurance benefits)  Hypnosis for smoking cessation  [employer] [phone]  Acupuncture for smoking cessation  [facility] [phone]   

## 2022-09-18 NOTE — Progress Notes (Signed)
Virtual Visit via Telephone Note  I connected with Rickard Rhymes on 09/18/22 at 11:30 AM EDT by telephone and verified that I am speaking with the correct person using two identifiers.  Location: Patient:  At home Provider:  [place], [place], [place], [address]    I discussed the limitations, risks, security and privacy concerns of performing an evaluation and management service by telephone and the availability of in person appointments. I also discussed with the patient that there may be a patient responsible charge related to this service. The patient expressed understanding and agreed to proceed.    Shared Decision Making Visit Lung Cancer Screening Program [phone])   Eligibility: Age 66 y.o. Pack Years Smoking History Calculation  76.25  pack year smoking history (# packs/per year x # years smoked) Recent History of coughing up blood  no Unexplained weight loss? no ( >Than 15 pounds within the last 6 months ) Prior History Lung / other cancer no (Diagnosis within the last 5 years already requiring surveillance chest CT Scans). Smoking Status Current Smoker Former Smokers: Years since quit:  NA  Quit Date:  NA  Visit Components: Discussion included one or more decision making aids. yes Discussion included risk/benefits of screening. yes Discussion included potential follow up diagnostic testing for abnormal scans. yes Discussion included meaning and risk of over diagnosis. yes Discussion included meaning and risk of False Positives. yes Discussion included meaning of total radiation exposure. yes  Counseling Included: Importance of adherence to annual lung cancer LDCT screening. yes Impact of comorbidities on ability to participate in the program. yes Ability and willingness to under diagnostic treatment. yes  Smoking Cessation Counseling: Current Smokers:  Discussed importance of smoking cessation. yes Information about tobacco cessation classes and  interventions provided to patient. yes Patient provided with "ticket" for LDCT Scan. yes Symptomatic Patient. no  Counseling NA Diagnosis Code: Tobacco Use Z72.0 Asymptomatic Patient yes  Counseling (Intermediate counseling: > three minutes counseling) [id number] Former Smokers:  Discussed the importance of maintaining cigarette abstinence. yes Diagnosis Code: Personal History of Nicotine Dependence. G86.761 Information about tobacco cessation classes and interventions provided to patient. Yes Patient provided with "ticket" for LDCT Scan. yes Written Order for Lung Cancer Screening with LDCT placed in Epic. Yes (CT Chest Lung Cancer Screening Low Dose W/O CM) [id number] Z12.2-Screening of respiratory organs Z87.891-Personal history of nicotine dependence  I have spent 25 minutes of face to face/ virtual visit   time with  Mr. Baughman discussing the risks and benefits of lung cancer screening. We viewed / discussed a power point together that explained in detail the above noted topics. We paused at intervals to allow for questions to be asked and answered to ensure understanding.We discussed that the single most powerful action that he can take to decrease his risk of developing lung cancer is to quit smoking. We discussed whether or not he is ready to commit to setting a quit date. We discussed options for tools to aid in quitting smoking including nicotine replacement therapy, non-nicotine medications, support groups, Quit Smart classes, and behavior modification. We discussed that often times setting smaller, more achievable goals, such as eliminating 1 cigarette a day for a week and then 2 cigarettes a day for a week can be helpful in slowly decreasing the number of cigarettes smoked. This allows for a sense of accomplishment as well as providing a clinical benefit. I provided  him  with smoking cessation  information  with contact information for  community resources, classes, free nicotine replacement  therapy, and access to mobile apps, text messaging, and on-line smoking cessation help. I have also provided  him  the office contact information in the event he needs to contact me, or the screening staff. We discussed the time and location of the scan, and that either Doroteo Glassman RN, Joella Prince, RN  or I will call / send a letter with the results within 24-72 hours of receiving them. The patient verbalized understanding of all of  the above and had no further questions upon leaving the office. They have my contact information in the event they have any further questions.  I spent 3 minutes counseling on smoking cessation and the health risks of continued tobacco abuse.  I explained to the patient that there has been a high incidence of coronary artery disease noted on these exams. I explained that this is a non-gated exam therefore degree or severity cannot be determined. This patient is not on statin therapy. I have asked the patient to follow-up with their PCP regarding any incidental finding of coronary artery disease and management with diet or medication as their PCP  feels is clinically indicated. The patient verbalized understanding of the above and had no further questions upon completion of the visit.      Magdalen Spatz, NP 09/18/2022

## 2022-09-19 ENCOUNTER — Ambulatory Visit
Admission: RE | Admit: 2022-09-19 | Discharge: 2022-09-19 | Disposition: A | Discharge status: Home / Self Care | Payer: PPO | Source: Ambulatory Visit | Attending: Family Medicine | Admitting: Family Medicine

## 2022-09-19 DIAGNOSIS — F17210 Nicotine dependence, cigarettes, uncomplicated: Secondary | ICD-10-CM

## 2022-09-19 DIAGNOSIS — Z87891 Personal history of nicotine dependence: Secondary | ICD-10-CM

## 2022-09-19 DIAGNOSIS — Z122 Encounter for screening for malignant neoplasm of respiratory organs: Secondary | ICD-10-CM

## 2022-09-25 DIAGNOSIS — N401 Enlarged prostate with lower urinary tract symptoms: Secondary | ICD-10-CM | POA: Diagnosis not present

## 2022-10-02 DIAGNOSIS — R351 Nocturia: Secondary | ICD-10-CM | POA: Diagnosis not present

## 2022-10-02 DIAGNOSIS — N5201 Erectile dysfunction due to arterial insufficiency: Secondary | ICD-10-CM | POA: Diagnosis not present

## 2022-10-02 DIAGNOSIS — N401 Enlarged prostate with lower urinary tract symptoms: Secondary | ICD-10-CM | POA: Diagnosis not present

## 2022-10-02 DIAGNOSIS — R3121 Asymptomatic microscopic hematuria: Secondary | ICD-10-CM | POA: Diagnosis not present

## 2022-10-07 DIAGNOSIS — G894 Chronic pain syndrome: Secondary | ICD-10-CM | POA: Diagnosis not present

## 2022-10-07 DIAGNOSIS — Z9689 Presence of other specified functional implants: Secondary | ICD-10-CM | POA: Diagnosis not present

## 2022-10-07 DIAGNOSIS — G90521 Complex regional pain syndrome I of right lower limb: Secondary | ICD-10-CM | POA: Diagnosis not present

## 2022-12-16 DIAGNOSIS — G90521 Complex regional pain syndrome I of right lower limb: Secondary | ICD-10-CM | POA: Diagnosis not present

## 2022-12-16 DIAGNOSIS — F1120 Opioid dependence, uncomplicated: Secondary | ICD-10-CM | POA: Diagnosis not present

## 2022-12-16 DIAGNOSIS — Z9689 Presence of other specified functional implants: Secondary | ICD-10-CM | POA: Diagnosis not present

## 2022-12-16 DIAGNOSIS — G894 Chronic pain syndrome: Secondary | ICD-10-CM | POA: Diagnosis not present

## 2022-12-17 DIAGNOSIS — U071 COVID-19: Secondary | ICD-10-CM | POA: Diagnosis not present

## 2023-02-27 DIAGNOSIS — G894 Chronic pain syndrome: Secondary | ICD-10-CM | POA: Diagnosis not present

## 2023-02-27 DIAGNOSIS — Z9689 Presence of other specified functional implants: Secondary | ICD-10-CM | POA: Diagnosis not present

## 2023-02-27 DIAGNOSIS — F1120 Opioid dependence, uncomplicated: Secondary | ICD-10-CM | POA: Diagnosis not present

## 2023-02-27 DIAGNOSIS — G90521 Complex regional pain syndrome I of right lower limb: Secondary | ICD-10-CM | POA: Diagnosis not present

## 2023-05-20 DIAGNOSIS — Z9689 Presence of other specified functional implants: Secondary | ICD-10-CM | POA: Diagnosis not present

## 2023-05-20 DIAGNOSIS — G894 Chronic pain syndrome: Secondary | ICD-10-CM | POA: Diagnosis not present

## 2023-05-20 DIAGNOSIS — G90521 Complex regional pain syndrome I of right lower limb: Secondary | ICD-10-CM | POA: Diagnosis not present

## 2023-05-20 DIAGNOSIS — F1120 Opioid dependence, uncomplicated: Secondary | ICD-10-CM | POA: Diagnosis not present

## 2023-06-26 DIAGNOSIS — I77811 Abdominal aortic ectasia: Secondary | ICD-10-CM | POA: Diagnosis not present

## 2023-06-26 DIAGNOSIS — G90529 Complex regional pain syndrome I of unspecified lower limb: Secondary | ICD-10-CM | POA: Diagnosis not present

## 2023-06-26 DIAGNOSIS — Z136 Encounter for screening for cardiovascular disorders: Secondary | ICD-10-CM | POA: Diagnosis not present

## 2023-06-26 DIAGNOSIS — Z0000 Encounter for general adult medical examination without abnormal findings: Secondary | ICD-10-CM | POA: Diagnosis not present

## 2023-06-26 DIAGNOSIS — Z13220 Encounter for screening for lipoid disorders: Secondary | ICD-10-CM | POA: Diagnosis not present

## 2023-06-26 DIAGNOSIS — Z122 Encounter for screening for malignant neoplasm of respiratory organs: Secondary | ICD-10-CM | POA: Diagnosis not present

## 2023-06-26 DIAGNOSIS — F17210 Nicotine dependence, cigarettes, uncomplicated: Secondary | ICD-10-CM | POA: Diagnosis not present

## 2023-06-26 DIAGNOSIS — Z89412 Acquired absence of left great toe: Secondary | ICD-10-CM | POA: Diagnosis not present

## 2023-06-26 DIAGNOSIS — K580 Irritable bowel syndrome with diarrhea: Secondary | ICD-10-CM | POA: Diagnosis not present

## 2023-06-26 DIAGNOSIS — Z125 Encounter for screening for malignant neoplasm of prostate: Secondary | ICD-10-CM | POA: Diagnosis not present

## 2023-06-26 DIAGNOSIS — R7301 Impaired fasting glucose: Secondary | ICD-10-CM | POA: Diagnosis not present

## 2023-08-20 DIAGNOSIS — Z9689 Presence of other specified functional implants: Secondary | ICD-10-CM | POA: Diagnosis not present

## 2023-08-20 DIAGNOSIS — G90521 Complex regional pain syndrome I of right lower limb: Secondary | ICD-10-CM | POA: Diagnosis not present

## 2023-08-20 DIAGNOSIS — F1120 Opioid dependence, uncomplicated: Secondary | ICD-10-CM | POA: Diagnosis not present

## 2023-09-22 DIAGNOSIS — N401 Enlarged prostate with lower urinary tract symptoms: Secondary | ICD-10-CM | POA: Diagnosis not present

## 2023-09-29 DIAGNOSIS — R351 Nocturia: Secondary | ICD-10-CM | POA: Diagnosis not present

## 2023-09-29 DIAGNOSIS — R3912 Poor urinary stream: Secondary | ICD-10-CM | POA: Diagnosis not present

## 2023-09-29 DIAGNOSIS — N401 Enlarged prostate with lower urinary tract symptoms: Secondary | ICD-10-CM | POA: Diagnosis not present

## 2023-09-29 DIAGNOSIS — R3121 Asymptomatic microscopic hematuria: Secondary | ICD-10-CM | POA: Diagnosis not present

## 2024-02-18 DIAGNOSIS — Z9689 Presence of other specified functional implants: Secondary | ICD-10-CM | POA: Diagnosis not present

## 2024-02-18 DIAGNOSIS — G90521 Complex regional pain syndrome I of right lower limb: Secondary | ICD-10-CM | POA: Diagnosis not present

## 2024-02-18 DIAGNOSIS — F1120 Opioid dependence, uncomplicated: Secondary | ICD-10-CM | POA: Diagnosis not present

## 2024-05-17 DIAGNOSIS — Z9689 Presence of other specified functional implants: Secondary | ICD-10-CM | POA: Diagnosis not present

## 2024-05-17 DIAGNOSIS — F1120 Opioid dependence, uncomplicated: Secondary | ICD-10-CM | POA: Diagnosis not present

## 2024-05-17 DIAGNOSIS — G90521 Complex regional pain syndrome I of right lower limb: Secondary | ICD-10-CM | POA: Diagnosis not present

## 2024-08-09 DIAGNOSIS — Z9689 Presence of other specified functional implants: Secondary | ICD-10-CM | POA: Diagnosis not present

## 2024-08-09 DIAGNOSIS — F1120 Opioid dependence, uncomplicated: Secondary | ICD-10-CM | POA: Diagnosis not present

## 2024-08-09 DIAGNOSIS — G90521 Complex regional pain syndrome I of right lower limb: Secondary | ICD-10-CM | POA: Diagnosis not present

## 2024-08-12 DIAGNOSIS — M67911 Unspecified disorder of synovium and tendon, right shoulder: Secondary | ICD-10-CM | POA: Diagnosis not present
